# Patient Record
Sex: Male | Born: 1975 | Hispanic: Yes | Smoking: Never smoker
Health system: Southern US, Community
[De-identification: ages and names within clinical notes are randomized; demographics above are authoritative.]

## PROBLEM LIST (undated history)

## (undated) DIAGNOSIS — F419 Anxiety disorder, unspecified: Secondary | ICD-10-CM

## (undated) DIAGNOSIS — M109 Gout, unspecified: Secondary | ICD-10-CM

## (undated) DIAGNOSIS — E785 Hyperlipidemia, unspecified: Secondary | ICD-10-CM

## (undated) DIAGNOSIS — T7840XA Allergy, unspecified, initial encounter: Secondary | ICD-10-CM

## (undated) DIAGNOSIS — F32A Depression, unspecified: Secondary | ICD-10-CM

## (undated) DIAGNOSIS — I10 Essential (primary) hypertension: Secondary | ICD-10-CM

## (undated) HISTORY — DX: Hyperlipidemia, unspecified: E78.5

## (undated) HISTORY — DX: Allergy, unspecified, initial encounter: T78.40XA

## (undated) HISTORY — DX: Depression, unspecified: F32.A

## (undated) HISTORY — DX: Essential (primary) hypertension: I10

## (undated) HISTORY — DX: Anxiety disorder, unspecified: F41.9

---

## 1999-05-09 ENCOUNTER — Emergency Department (HOSPITAL_COMMUNITY): Admission: EM | Admit: 1999-05-09 | Discharge: 1999-05-09 | Payer: Self-pay | Admitting: Emergency Medicine

## 1999-07-20 ENCOUNTER — Ambulatory Visit (HOSPITAL_COMMUNITY): Admission: RE | Admit: 1999-07-20 | Discharge: 1999-07-20 | Payer: Self-pay | Admitting: *Deleted

## 2000-06-09 ENCOUNTER — Emergency Department (HOSPITAL_COMMUNITY): Admission: EM | Admit: 2000-06-09 | Discharge: 2000-06-09 | Payer: Self-pay | Admitting: Emergency Medicine

## 2000-06-24 ENCOUNTER — Emergency Department (HOSPITAL_COMMUNITY): Admission: EM | Admit: 2000-06-24 | Discharge: 2000-06-24 | Payer: Self-pay | Admitting: Family Medicine

## 2012-05-29 ENCOUNTER — Other Ambulatory Visit: Payer: Self-pay | Admitting: Physician Assistant

## 2012-05-29 ENCOUNTER — Ambulatory Visit
Admission: RE | Admit: 2012-05-29 | Discharge: 2012-05-29 | Disposition: A | Discharge status: Home / Self Care | Payer: No Typology Code available for payment source | Source: Ambulatory Visit | Attending: Physician Assistant | Admitting: Physician Assistant

## 2012-05-29 DIAGNOSIS — R609 Edema, unspecified: Secondary | ICD-10-CM

## 2012-05-29 DIAGNOSIS — R52 Pain, unspecified: Secondary | ICD-10-CM

## 2012-06-03 ENCOUNTER — Telehealth: Payer: Self-pay

## 2012-10-10 NOTE — Telephone Encounter (Signed)
o

## 2014-02-21 ENCOUNTER — Encounter (HOSPITAL_COMMUNITY): Payer: Self-pay | Admitting: Emergency Medicine

## 2014-02-21 ENCOUNTER — Emergency Department (INDEPENDENT_AMBULATORY_CARE_PROVIDER_SITE_OTHER)
Admission: EM | Admit: 2014-02-21 | Discharge: 2014-02-21 | Disposition: A | Discharge status: Home / Self Care | Payer: Self-pay | Source: Home / Self Care

## 2014-02-21 DIAGNOSIS — X58XXXA Exposure to other specified factors, initial encounter: Secondary | ICD-10-CM

## 2014-02-21 DIAGNOSIS — S93409A Sprain of unspecified ligament of unspecified ankle, initial encounter: Secondary | ICD-10-CM

## 2014-02-21 HISTORY — DX: Gout, unspecified: M10.9

## 2014-02-21 MED ORDER — INDOMETHACIN 50 MG PO CAPS: 50.0000 mg | ORAL_CAPSULE | Freq: Two times a day (BID) | ORAL | Status: DC

## 2014-02-21 MED ORDER — TRAMADOL HCL 50 MG PO TABS: 50.0000 mg | ORAL_TABLET | Freq: Four times a day (QID) | ORAL | Status: DC | PRN

## 2014-02-21 NOTE — ED Provider Notes (Signed)
CSN: [id number]     Arrival date & time 02/21/14  1516 History   First MD Initiated Contact with Patient 02/21/14 1559     Chief Complaint  Patient presents with  . Joint Swelling   (Consider location/radiation/quality/duration/timing/severity/associated sxs/prior Treatment) HPI Comments: 1 week ago pt noticed ankle pain radiating to the foot after a weekend of binge drinking. There is no known trauma, fall or other injury.  Pt points to areas of tenderness surrounding the ankle and foot ligaments. Has been ambulating with full wt bearing and sx' worsening.  Hx of similar occurrence 1-2 y ago and had a neg xray and given a green pill. The sig other is st this must be the same .    Past Medical History  Diagnosis Date  . Gout    History reviewed. No pertinent past surgical history. No family history on file. History  Substance Use Topics  . Smoking status: Passive Smoke Exposure - Never Smoker  . Smokeless tobacco: Not on file  . Alcohol Use: Yes     Comment: socially    Review of Systems  Constitutional: Negative.   Respiratory: Negative.   Gastrointestinal: Negative.   Genitourinary: Negative.   Musculoskeletal: Positive for joint swelling. Negative for myalgias.       As per HPI  Skin: Negative.   Neurological: Negative for weakness and numbness.    Allergies  Review of patient's allergies indicates no known allergies.  Home Medications   Prior to Admission medications   Not on File   BP 154/102  Pulse 82  Temp(Src) 98.2 F (36.8 C) (Oral)  Resp 18  SpO2 95% Physical Exam  Nursing note and vitals reviewed. Constitutional: He is oriented to person, place, and time. He appears well-developed and well-nourished. No distress.  HENT:  Head: Normocephalic and atraumatic.  Neck: Normal range of motion. Neck supple.  Musculoskeletal:  Swelling around the ankle and proximal foot. No erythema or other discoloration. No deformities or bony tenderness. No  abnormalities to the toes or forefoot . ROM limited due to pain around the bilateral ankle ligaments including the achilles. No signs of infection or gout or arthritis. No skin sensitivity.  Neurological: He is alert and oriented to person, place, and time. No cranial nerve deficit. He exhibits normal muscle tone.  Skin: Skin is warm and dry.  Psychiatric: He has a normal mood and affect.    ED Course  Procedures (including critical care time) Labs Review Labs Reviewed - No data to display  No results found for this or any previous visit. Imaging Review No results found.   MDM   1. Ankle sprain    ASO RICE Limit wt bearing. Follow with orhto above if not improving.    Hayden Rasmussenavid Nayquan Evinger, NP 02/21/14 [phone]

## 2014-02-21 NOTE — Discharge Instructions (Signed)
Foot Sprain The muscles and cord like structures which attach muscle to bone (tendons) that surround the feet are made up of units. A foot sprain can occur at the weakest spot in any of these units. This condition is most often caused by injury to or overuse of the foot, as from playing contact sports, or aggravating a previous injury, or from poor conditioning, or obesity. SYMPTOMS  Pain with movement of the foot.  Tenderness and swelling at the injury site.  Loss of strength is present in moderate or severe sprains. THE THREE GRADES OR SEVERITY OF FOOT SPRAIN ARE:  Mild (Grade I): Slightly pulled muscle without tearing of muscle or tendon fibers or loss of strength.  Moderate (Grade II): Tearing of fibers in a muscle, tendon, or at the attachment to bone, with small decrease in strength.  Severe (Grade III): Rupture of the muscle-tendon-bone attachment, with separation of fibers. Severe sprain requires surgical repair. Often repeating (chronic) sprains are caused by overuse. Sudden (acute) sprains are caused by direct injury or over-use. DIAGNOSIS  Diagnosis of this condition is usually by your own observation. If problems continue, a caregiver may be required for further evaluation and treatment. X-rays may be required to make sure there are not breaks in the bones (fractures) present. Continued problems may require physical therapy for treatment. PREVENTION  Use strength and conditioning exercises appropriate for your sport.  Warm up properly prior to working out.  Use athletic shoes that are made for the sport you are participating in.  Allow adequate time for healing. Early return to activities makes repeat injury more likely, and can lead to an unstable arthritic foot that can result in prolonged disability. Mild sprains generally heal in 3 to 10 days, with moderate and severe sprains taking 2 to 10 weeks. Your caregiver can help you determine the proper time required for  healing. HOME CARE INSTRUCTIONS   Apply ice to the injury for 15-20 minutes, 03-04 times per day. Put the ice in a plastic bag and place a towel between the bag of ice and your skin.  An elastic wrap (like an Ace bandage) may be used to keep swelling down.  Keep foot above the level of the heart, or at least raised on a footstool, when swelling and pain are present.  Try to avoid use other than gentle range of motion while the foot is painful. Do not resume use until instructed by your caregiver. Then begin use gradually, not increasing use to the point of pain. If pain does develop, decrease use and continue the above measures, gradually increasing activities that do not cause discomfort, until you gradually achieve normal use.  Use crutches if and as instructed, and for the length of time instructed.  Keep injured foot and ankle wrapped between treatments.  Massage foot and ankle for comfort and to keep swelling down. Massage from the toes up towards the knee.  Only take over-the-counter or prescription medicines for pain, discomfort, or fever as directed by your caregiver. SEEK IMMEDIATE MEDICAL CARE IF:   Your pain and swelling increase, or pain is not controlled with medications.  You have loss of feeling in your foot or your foot turns cold or blue.  You develop new, unexplained symptoms, or an increase of the symptoms that brought you to your caregiver. MAKE SURE YOU:   Understand these instructions.  Will watch your condition.  Will get help right away if you are not doing well or get worse. Document Released:  MAKE SURE YOU:    Understand these instructions.   Will watch your condition.   Will get help right away if you are not doing well or get worse.  Document Released: 04/13/2002 Document Revised: 01/14/2012 Document Reviewed: 06/10/2008  ExitCare Patient Information 2014 [employer]  Ankle Sprain  An ankle sprain is an injury to the strong, fibrous tissues (ligaments) that hold the bones of your ankle joint together.   CAUSES  An ankle sprain is usually caused by a fall or by twisting your ankle. Ankle sprains most commonly occur  when you step on the outer edge of your foot, and your ankle turns inward. People who participate in sports are more prone to these types of injuries.   SYMPTOMS    Pain in your ankle. The pain may be present at rest or only when you are trying to stand or walk.   Swelling.   Bruising. Bruising may develop immediately or within 1 to 2 days after your injury.   Difficulty standing or walking, particularly when turning corners or changing directions.  DIAGNOSIS   Your caregiver will ask you details about your injury and perform a physical exam of your ankle to determine if you have an ankle sprain. During the physical exam, your caregiver will press on and apply pressure to specific areas of your foot and ankle. Your caregiver will try to move your ankle in certain ways. An X-ray exam may be done to be sure a bone was not broken or a ligament did not separate from one of the bones in your ankle (avulsion fracture).   TREATMENT   Certain types of braces can help stabilize your ankle. Your caregiver can make a recommendation for this. Your caregiver may recommend the use of medicine for pain. If your sprain is severe, your caregiver may refer you to a surgeon who helps to restore function to parts of your skeletal system (orthopedist) or a physical therapist.  HOME CARE INSTRUCTIONS    Apply ice to your injury for 1 2 days or as directed by your caregiver. Applying ice helps to reduce inflammation and pain.   Put ice in a plastic bag.   Place a towel between your skin and the bag.   Leave the ice on for 15-20 minutes at a time, every 2 hours while you are awake.   Only take over-the-counter or prescription medicines for pain, discomfort, or fever as directed by your caregiver.   Elevate your injured ankle above the level of your heart as much as possible for 2 3 days.   If your caregiver recommends crutches, use them as instructed. Gradually put weight on the affected ankle. Continue to use crutches or a cane  until you can walk without feeling pain in your ankle.   If you have a plaster splint, wear the splint as directed by your caregiver. Do not rest it on anything harder than a pillow for the first 24 hours. Do not put weight on it. Do not get it wet. You may take it off to take a shower or bath.   You may have been given an elastic bandage to wear around your ankle to provide support. If the elastic bandage is too tight (you have numbness or tingling in your foot or your foot becomes cold and blue), adjust the bandage to make it comfortable.   If you have an air splint, you may blow more air into it or let air out to make it more   comfortable. You may take your splint off at night and before taking a shower or bath. Wiggle your toes in the splint several times per day to decrease swelling.  SEEK MEDICAL CARE IF:    You have rapidly increasing bruising or swelling.   Your toes feel extremely cold or you lose feeling in your foot.   Your pain is not relieved with medicine.  SEEK IMMEDIATE MEDICAL CARE IF:   Your toes are numb or blue.   You have severe pain that is increasing.  MAKE SURE YOU:    Understand these instructions.   Will watch your condition.   Will get help right away if you are not doing well or get worse.  Document Released: 10/22/2005 Document Revised: 07/16/2012 Document Reviewed: 11/03/2011  Exit[employer] Patient Information 2014 [employer].

## 2014-02-21 NOTE — ED Notes (Signed)
Denies inj.  C/O left lateral ankle pain and swelling since Sunday; pain radiating some into foot & up into lower leg.  Has felt feverish intermittently.  Has tried Epsom salts, ice, muscle rubs, IBU without relief.  C/O painful weightbearing.  Was told last yr he "probably has gout".

## 2014-02-22 NOTE — ED Provider Notes (Signed)
Medical screening examination/treatment/procedure(s) were performed by non-physician practitioner and as supervising physician I was immediately available for consultation/collaboration.  Leslee Homeavid Monaca Wadas, M.D.   Reuben Likesavid C Brigette Hopfer, MD 02/22/14 1031

## 2014-08-24 ENCOUNTER — Encounter (HOSPITAL_COMMUNITY): Payer: Self-pay | Admitting: Emergency Medicine

## 2014-08-24 ENCOUNTER — Emergency Department (HOSPITAL_COMMUNITY): Payer: Self-pay

## 2014-08-24 ENCOUNTER — Emergency Department (HOSPITAL_COMMUNITY)
Admission: EM | Admit: 2014-08-24 | Discharge: 2014-08-24 | Disposition: A | Discharge status: Home / Self Care | Payer: Self-pay | Attending: Emergency Medicine | Admitting: Emergency Medicine

## 2014-08-24 DIAGNOSIS — M109 Gout, unspecified: Secondary | ICD-10-CM | POA: Insufficient documentation

## 2014-08-24 MED ORDER — INDOMETHACIN 25 MG PO CAPS: 25.0000 mg | ORAL_CAPSULE | Freq: Three times a day (TID) | ORAL | Status: DC | PRN

## 2014-08-24 MED ORDER — INDOMETHACIN 25 MG PO CAPS
25.0000 mg | ORAL_CAPSULE | Freq: Once | ORAL | Status: AC
  Administered 2014-08-24: 25 mg via ORAL
  Filled 2014-08-24: qty 1

## 2014-08-24 MED ORDER — HYDROCODONE-ACETAMINOPHEN 5-325 MG PO TABS: 1.0000 | ORAL_TABLET | ORAL | Status: DC | PRN

## 2014-08-24 MED ORDER — OXYCODONE-ACETAMINOPHEN 5-325 MG PO TABS
1.0000 | ORAL_TABLET | Freq: Once | ORAL | Status: AC
  Administered 2014-08-24: 1 via ORAL
  Filled 2014-08-24: qty 1

## 2014-08-24 NOTE — Progress Notes (Signed)
[facility] Stacy,  ° °Provided pt with a list of primary care resources and a [facility] [employer] [facility] application to help patient establish a pcp.  °

## 2014-08-24 NOTE — ED Provider Notes (Signed)
Medical screening examination/treatment/procedure(s) were performed by non-physician practitioner and as supervising physician I was immediately available for consultation/collaboration.   EKG Interpretation None       Abbott Jasinski, MD 08/24/14 1626 

## 2014-08-24 NOTE — ED Notes (Signed)
Pt co left ankle edema started on Saturday. Pt denies injury to ankle. Pt states that is painful 10/10.

## 2014-08-24 NOTE — ED Provider Notes (Signed)
CSN: [id number]     Arrival date & time 08/24/14  0830 History   First MD Initiated Contact with Patient 08/24/14 [phone]     Chief Complaint  Patient presents with  . Ankle Pain    left;edema     (Consider location/radiation/quality/duration/timing/severity/associated sxs/prior Treatment) HPI  Patient to the ED withcomplaints of swelling and pain to his left ankle for 2 days. He has a history of the same presentation multiple times over the past few years. He has been told before that it is gout. He denies injury to the ankle. 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 Denies fevers, nausea, weakness. Reports pain with even light touch.  Past Medical History  Diagnosis Date  . Gout    History reviewed. No pertinent past surgical history. No family history on file. History  Substance Use Topics  . Smoking status: Never Smoker   . Smokeless tobacco: Not on file  . Alcohol Use: Yes     Comment: socially    Review of Systems   Review of Systems  Gen: no weight loss, fevers, chills, night sweats  Eyes: no occular draining, occular pain,  No visual changes  Nose: no epistaxis or rhinorrhea  Mouth: no dental pain, no sore throat  Neck: no neck pain  Lungs: No hemoptysis. No wheezing or coughing CV:  No palpitations, dependent edema or orthopnea. No chest pain Abd: no diarrhea. No nausea or vomiting, No abdominal pain  GU: no dysuria or gross hematuria  MSK:  No muscle weakness, + left ankle pain Neuro: no headache, no focal neurologic deficits  Skin: no rash , no wounds Psyche: no complaints of depression or anxiety    Allergies  Review of patient's allergies indicates no known allergies.  Home Medications   Prior to Admission medications   Medication Sig Start Date End Date Taking? Authorizing Provider  ibuprofen (ADVIL,MOTRIN) 200 MG tablet Take 400 mg by mouth every 4 (four) hours as needed for moderate pain.   Yes Historical  Provider, MD  HYDROcodone-acetaminophen (NORCO/VICODIN) 5-325 MG per tablet Take 1-2 tablets by mouth every 4 (four) hours as needed for moderate pain or severe pain. 08/24/14   Annalycia Done Irine SealG Mele Sylvester, PA-C  indomethacin (INDOCIN) 25 MG capsule Take 1 capsule (25 mg total) by mouth 3 (three) times daily as needed. 08/24/14   Weber Monnier Irine SealG Ramandeep Arington, PA-C   BP 148/93  Pulse 75  Temp(Src) 98.5 F (36.9 C) (Oral)  Resp 16  SpO2 98% Physical Exam  Nursing note and vitals reviewed. Constitutional: He appears well-developed and well-nourished. No distress.  HENT:  Head: Normocephalic and atraumatic.  Eyes: Pupils are equal, round, and reactive to light.  Neck: Normal range of motion. Neck supple.  Cardiovascular: Normal rate and regular rhythm.   Pulmonary/Chest: Effort normal.  Abdominal: Soft.  Musculoskeletal:       Left ankle: He exhibits decreased range of motion (due to pain) and swelling. He exhibits no ecchymosis, no deformity, no laceration and normal pulse. Tenderness (to light touch).  Ankle is indurated  Neurological: He is alert.  Skin: Skin is warm and dry.    ED Course  Procedures (including critical care time) Labs Review Labs Reviewed - No data to display  Imaging Review Dg Ankle Complete Left  08/24/2014   CLINICAL DATA:  Left ankle pain starting on Saturday morning. No known injury. Lateral pain and soft tissue swelling.  EXAM: LEFT ANKLE COMPLETE - 3+ VIEW  COMPARISON:  05/29/2012  FINDINGS: There are stable degenerative changes likely related to previous ankle sprains and avulsion injuries. No acute fracture or osteochondral lesion. Os trigonum is noted. Suspect a small ankle joint effusion. The mid and hindfoot bony structures are intact.  IMPRESSION: Stable ankle joint degenerative changes without acute bony abnormality.  Possible ankle joint effusion.   Electronically Signed   By: Loralie ChampagneMark  Gallerani M.D.   On: 08/24/2014 09:08     EKG Interpretation None      MDM    Final diagnoses:  Acute gout of left ankle, unspecified cause    Patient has received listed medications in the ED: Medications  oxyCODONE-acetaminophen (PERCOCET/ROXICET) 5-325 MG per tablet 1 tablet (not administered)  indomethacin (INDOCIN) capsule 25 mg (not administered)    Patient with hx of gout, says this feels like the same. Denies injury or feeling sick. Has crutches from home that he is using. I considered possible infection, however, the patient does not have any risk factors for infection. Discussed need to follow-up if symptoms are not improving.  38 y.o.Eric Sanford's evaluation in the Emergency Department is complete. It has been determined that no acute conditions requiring further emergency intervention are present at this time. The patient/guardian have been advised of the diagnosis and plan. We have discussed signs and symptoms that warrant return to the ED, such as changes or worsening in symptoms.  Vital signs are stable at discharge. Filed Vitals:   08/24/14 0835  BP: 148/93  Pulse: 75  Temp: 98.5 F (36.9 C)  Resp: 16    Patient/guardian has voiced understanding and agreed to follow-up with the PCP or specialist.     Dorthula Matasiffany G Nekayla Heider, PA-C 08/24/14 [phone]

## 2014-08-24 NOTE — ED Notes (Signed)
Patient transported to X-ray 

## 2014-08-24 NOTE — Discharge Instructions (Signed)
Ankle Fusion  Ankle fusion surgery joins (fuses) ankle and leg bones together. The surgery uses devices such as screws, plates, rods, pins, and sometimes bone graft material. This is usually done to repair damage to the ankle due to arthritis, injury, or infection. It is also done to repair other ankle and foot conditions that cause pain.  LET YOUR CAREGIVER KNOW ABOUT:   · Allergies to food or medicine.  · Medicines taken, including vitamins, herbs, eyedrops, over-the-counter medicines, and creams.  · Use of steroids (by mouth or creams).  · Previous problems with anesthetics or numbing medicines.  · History of bleeding problems or blood clots.  · Previous surgery.  · Other health problems, including diabetes and kidney problems.  · Possibility of pregnancy, if this applies.  RISKS AND COMPLICATIONS   As with any surgery, complications may occur. However, they can usually be managed by your caregiver. General surgical complications may include:  · Reaction to anesthesia.  · Damage to surrounding nerves, tissues, or blood vessels.  · Infection.  · Bleeding.  · Scarring.  · Blood clot.  With appropriate treatment and rehabilitation, the following complications are very uncommon:  · Failure to heal (nonunion).  · Healing in a poor position (malunion).  · Stiff ankle or loss of mobility.  BEFORE THE PROCEDURE   Follow your caregiver's instructions prior to your surgery to avoid complications. A physical exam and X-rays may be performed as well. You may be asked to:   · Stop taking certain medicines for several days prior to your surgery, such as blood thinners (anticoagulants).  · Avoid eating and drinking for at least 8 hours before the surgery. This will help you avoid complications from anesthesia.  · Quit smoking, if this applies. Smoking increases the chances of a healing problem after your surgery. If you are thinking about quitting, ask your surgeon how long before the surgery you should stop smoking. Ask your  primary caregiver about approaches to help you stop. This can include medicines.  · Arrange for a ride home after your surgery. Arrange for someone to help you with activities during recovery.  PROCEDURE   The surgery is done after you are given medicine that makes you sleep (general anesthetic) or medicine that makes you numb from the waist down (spinal anesthetic). You will be asleep and will not feel any pain. The surgery can be performed with open surgery or minimally invasive surgery using a thin, lighted tube inserted through a small cut (arthroscopy). There are several variations of ankle fusion, but the basic surgery involves the following:  Open Surgery  Cuts (incisions) are made on each side of the ankle to allow the surgeon to access the joint. Once the joint is opened, cartilage and bone surfaces may be removed and reshaped if needed. The joint is repositioned in the proper place and joined together with a device, such as screws or plates. The body's natural healing process will help fuse together newly formed surfaces within the ankle. Though less common, sometimes pins within the ankle joint are attached to outside rods (external fixator) to aid in the healing process.  Arthroscopic Surgery  Small keyhole incisions are made on either side of the ankle. The surgeon will insert small instruments into the ankle joint. A tiny camera allows the surgeon to view inside the ankle. Small tools are used to resurface the cartilage area and bones of the ankle. Screws are placed through the skin and ankle to help it stay   in position while it heals.  AFTER THE PROCEDURE   · You will likely be in the hospital for 1 to 2 days following surgery.  · Caregivers will help you to manage pain and swelling with medicines, ice, and raising (elevation) of the ankle.  · Full recovery will take several months, but you may be able to walk gently in about 12 weeks. Crutches will help you move around when you first return  home.  · A cast or splint will be applied to the lower leg. You will learn how to walk with crutches.  · Follow your caregiver's instructions for home care after surgery.  FOR MORE INFORMATION   [employer]: www.aaos.org  Document Released: 04/11/2010 Document Revised: 01/14/2012 Document Reviewed: 04/11/2010  [employer]® Patient Information ©2015 [employer]. This information is not intended to replace advice given to you by your health care provider. Make sure you discuss any questions you have with your health care provider.

## 2015-09-08 ENCOUNTER — Emergency Department (HOSPITAL_COMMUNITY): Payer: Self-pay

## 2015-09-08 ENCOUNTER — Encounter (HOSPITAL_COMMUNITY): Payer: Self-pay | Admitting: Emergency Medicine

## 2015-09-08 ENCOUNTER — Emergency Department (HOSPITAL_COMMUNITY)
Admission: EM | Admit: 2015-09-08 | Discharge: 2015-09-08 | Disposition: A | Discharge status: Home / Self Care | Payer: Self-pay | Attending: Emergency Medicine | Admitting: Emergency Medicine

## 2015-09-08 DIAGNOSIS — F410 Panic disorder [episodic paroxysmal anxiety] without agoraphobia: Secondary | ICD-10-CM | POA: Insufficient documentation

## 2015-09-08 DIAGNOSIS — M109 Gout, unspecified: Secondary | ICD-10-CM | POA: Insufficient documentation

## 2015-09-08 NOTE — Discharge Instructions (Signed)
FOLLOW UP WITH A PRIMARY CARE PROVIDER IF SYMPTOMS RECUR. A LIST OF PROVIDERS IS PART OF YOUR DISCHARGE INSTRUCTIONS. RETURN TO THE EMERGENCY DEPARTMENT AS NEEDED.  Panic Attacks Panic attacks are sudden, short-livedsurges of severe anxiety, fear, or discomfort. They may occur for no reason when you are relaxed, when you are anxious, or when you are sleeping. Panic attacks may occur for a number of reasons:   Healthy people occasionally have panic attacks in extreme, life-threatening situations, such as war or natural disasters. Normal anxiety is a protective mechanism of the body that helps [place] react to danger (fight or flight response).  Panic attacks are often seen with anxiety disorders, such as panic disorder, social anxiety disorder, generalized anxiety disorder, and phobias. Anxiety disorders cause excessive or uncontrollable anxiety. They may interfere with your relationships or other life activities.  Panic attacks are sometimes seen with other mental illnesses, such as depression and posttraumatic stress disorder.  Certain medical conditions, prescription medicines, and drugs of abuse can cause panic attacks. SYMPTOMS  Panic attacks start suddenly, peak within 20 minutes, and are accompanied by four or more of the following symptoms:  Pounding heart or fast heart rate (palpitations).  Sweating.  Trembling or shaking.  Shortness of breath or feeling smothered.  Feeling choked.  Chest pain or discomfort.  Nausea or strange feeling in your stomach.  Dizziness, light-headedness, or feeling like you will faint.  Chills or hot flushes.  Numbness or tingling in your lips or hands and feet.  Feeling that things are not real or feeling that you are not yourself.  Fear of losing control or going crazy.  Fear of dying. Some of these symptoms can mimic serious medical conditions. For example, you may think you are having a heart attack. Although panic attacks can be very scary,  they are not life threatening. DIAGNOSIS  Panic attacks are diagnosed through an assessment by your health care provider. Your health care provider will ask questions about your symptoms, such as where and when they occurred. Your health care provider will also ask about your medical history and use of alcohol and drugs, including prescription medicines. Your health care provider may order blood tests or other studies to rule out a serious medical condition. Your health care provider may refer you to a mental health professional for further evaluation. TREATMENT   Most healthy people who have one or two panic attacks in an extreme, life-threatening situation will not require treatment.  The treatment for panic attacks associated with anxiety disorders or other mental illness typically involves counseling with a mental health professional, medicine, or a combination of both. Your health care provider will help determine what treatment is best for you.  Panic attacks due to physical illness usually go away with treatment of the illness. If prescription medicine is causing panic attacks, talk with your health care provider about stopping the medicine, decreasing the dose, or substituting another medicine.  Panic attacks due to alcohol or drug abuse go away with abstinence. Some adults need professional help in order to stop drinking or using drugs. HOME CARE INSTRUCTIONS   Take all medicines as directed by your health care provider.   Schedule and attend follow-up visits as directed by your health care provider. It is important to keep all your appointments. SEEK MEDICAL CARE IF:  You are not able to take your medicines as prescribed.  Your symptoms do not improve or get worse. SEEK IMMEDIATE MEDICAL CARE IF:   You experience panic  attack symptoms that are different than your usual symptoms.  You have serious thoughts about hurting yourself or others.  You are taking medicine for panic  attacks and have a serious side effect. MAKE SURE YOU:  Understand these instructions.  Will watch your condition.  Will get help right away if you are not doing well or get worse.   This information is not intended to replace advice given to you by your health care provider. Make sure you discuss any questions you have with your health care provider.   Document Released: 10/22/2005 Document Revised: 10/27/2013 Document Reviewed: 06/05/2013 Elsevier Interactive Patient Education 2016 [employer]. Crisis de angustia (Panic Attacks) Las crisis de [place] son ataques repentinos y [place] de [place], miedo o Dentist extremos. Es posible que ocurran sin motivo, cuando est relajado, ansioso o cuando duerme. Las crisis de [place] pueden ocurrir por algunas de estas razones:   En ocasiones, las personas sanas presentan crisis de [place] en situaciones extremas, potencialmente mortales, como la guerra o los desastres naturales. La ansiedad normal es un mecanismo de defensa del cuerpo que nos ayuda a Publishing rights manager ante situaciones de peligro (respuesta de defensa o huida).  Con frecuencia, las crisis de [place] aparecen acompaadas de trastornos de ansiedad, como trastorno de pnico, trastorno de ansiedad social, trastorno de ansiedad generalizada y fobias. Los trastornos de ansiedad provocan ansiedad excesiva o incontrolable. Sus relaciones y [address] pueden verse Education officer, environmental.  En ocasiones, las crisis de ansiedad se presentan con otras enfermedades mentales, como la depresin y el trastorno por estrs postraumtico.  Algunas enfermedades, medicamentos recetados y drogas pueden provocar crisis de [place]. SNTOMAS  Las crisis de [place] comienzan repentinamente, Writer punto mximo a los 20 minutos y se presentan junto con cuatro o ms de los siguientes sntomas:  Latidos cardacos acelerados o frecuencia cardaca elevada (palpitaciones).  Sudoracin.  Temblores o  sacudidas.  Dificultad para respirar o sensacin de asfixia.  Sensacin de [employer].  Dolor o International aid/development worker.  Nuseas o sensacin extraa en el estmago.  Mareos, sensacin de desvanecimiento o de desmayo.  Escalofros o sofocos.  Hormigueos o adormecimiento en los labios o las manos y los pies.  Sensacin de [employer] no son reales o de que no es usted mismo.  Temor a perder el control o el juicio.  Temor a Musician. Algunos de estos sntomas pueden parecerse a enfermedades graves. Por ejemplo, es posible que piense que tendr un ataque cardaco. Aunque las crisis de [place] pueden ser muy atemorizantes, no son potencialmente mortales. DIAGNSTICO  Las crisis de [place] se diagnostican con una evaluacin que realiza el mdico. Su mdico le realizar preguntas sobre los sntomas, como cundo y dnde ocurrieron. Tambin le preguntar sobre su historia clnica y [place] consumo de alcohol y drogas, incluidos los medicamentos recetados. Es posible que su mdico le indique anlisis de sangre u otros estudios para Museum/gallery exhibitions officer graves. El mdico podr derivarlo a un profesional de la salud mental para que le realice una evaluacin ms profunda. TRATAMIENTO   En general, las personas sanas que registran una o [place] crisis de [place] bajo una situacin extrema, potencialmente mortal, no requerirn TEFL teacher.  El [place] de las crisis de [place] asociadas con trastornos de ansiedad u otras enfermedades mentales, generalmente, requiere orientacin por parte de un profesional de la salud mental medicamentos, o bien la combinacin de [place]. Su mdico le ayudar a Leisure centre manager tratamiento para usted.  Las crisis de [place] a enfermedades  fsicas, generalmente, desaparecen con el tratamiento de la enfermedad. Si un medicamento recetado le causa crisis de [place], consulte a su mdico si debe suspenderlo, disminuir la dosis o sustituirlo por otro  medicamento.  Las crisis de [place] asociadas al consumo de drogas o alcohol desaparecen con la abstinencia. Algunos adultos necesitan ayuda profesional para dejar de beber o de consumir drogas. INSTRUCCIONES PARA EL CUIDADO EN EL HOGAR   Tome todos los medicamentos como le indic el mdico.  Planifique y concurra a todas las visitas de control, segn le indique el mdico. Es importante que concurra a todas las visitas. SOLICITE ATENCIN MDICA SI:  No puede tomar los [employer] se lo han indicado.  Los sntomas no mejoran o empeoran. SOLICITE ATENCIN MDICA DE INMEDIATO SI:   Experimenta sntomas de crisis de [place] diferentes de los que presenta habitualmente.  Tiene pensamientos serios acerca de lastimarse a usted mismo o daar a Economist.  Toma medicamentos para las crisis de [place] y presenta efectos secundarios graves. ASEGRESE DE QUE:  Comprende estas instrucciones.  Controlar su afeccin.  Recibir ayuda de inmediato si no mejora o si empeora.   Esta informacin no tiene Theme park manager el consejo del mdico. Asegrese de hacerle al mdico cualquier pregunta que tenga.   Document Released: 10/22/2005 Document Revised: 10/27/2013 Elsevier Interactive Patient Education [employer]   Emergency Department Resource Guide 1) Find a Doctor and Pay Out of Pocket Although you won't have to find out who is covered by your insurance plan, it is a good idea to ask around and get recommendations. You will then need to call the office and see if the doctor you have chosen will accept you as a new patient and what types of options they offer for patients who are self-pay. Some doctors offer discounts or will set up payment plans for their patients who do not have insurance, but you will need to ask so you aren't surprised when you get to your appointment.  2) Contact Your Local Health Department Not all health departments have doctors that can see  patients for sick visits, but many do, so it is worth a call to see if yours does. If you don't know where your local health department is, you can check in your phone book. The CDC also has a tool to help you locate your state's health department, and many state websites also have listings of all of their local health departments.  3) Find a Walk-in Clinic If your illness is not likely to be very severe or complicated, you may want to try a walk in clinic. These are popping up all over the country in pharmacies, drugstores, and shopping centers. They're usually staffed by nurse practitioners or physician assistants that have been trained to treat common illnesses and complaints. They're usually fairly quick and inexpensive. However, if you have serious medical issues or chronic medical problems, these are probably not your best option.  No Primary Care Doctor: - Call Health Connect at  [phone] - they can help you locate a primary care doctor that  accepts your insurance, provides certain services, etc. - Physician Referral Service- [phone]  Chronic Pain Problems: Organization         Address  Phone   Notes  [facility]  [phone] Patients need to be referred by their primary care doctor.   Medication Assistance: Organization         Address  Phone   Notes  [facility] Medication Assistance Program [address] [place] [place], [place] [address] [phone] --Must be a resident of [facility] -- Must have NO insurance coverage whatsoever (no Medicaid/ Medicare, etc.) -- The pt. MUST have a primary care doctor that directs their care regularly and follows them in the community   Med[facility]ist  [phone]   [employer]  [phone]    Agencies that provide inexpensive medical care: Organization         Address  Phone   Notes  [facility]  [phone]   [facility]    [phone]   [facility] [address] [place], [place] [address] [phone]   [facility] [address] [place] [place] [phone]   Planned Parenthood    [phone]   [facility]    [phone]   [facility]  [address], [place] Phone:  [phone], Fax:  [phone] Hours of Operation:  9 am - 6 pm, M-F.  Also accepts Medicaid/Medicare and self-pay.  [facility]  [address], [place] Phone: [phone], Fax: [phone]. Hours of Operation:  8:30 am - 5:30 pm, M-F.  Also accepts Medicaid and self-pay.  [facility] [place] [address] [place] Phone: [phone]   [facility] [address] Natasha Bence [place], [place] [phone], Ext. [phone] Mondays & Thursdays: 7-9 AM.  First 15 patients are seen on a first come, first serve basis.    Medicaid-accepting [facility] Providers:  Organization         Address  Phone   Notes  [facility] [address], [place] [phone] Also accepts self-pay patients.  [facility] [address] Laurell Josephs [place], [place]  [phone]   [facility] [address], [place] [phone]   [facility] [address], [place] [phone]   Renaye Rakers [address], [place]   [phone] Only accepts [place] Access [place] patients after they have their name applied to their card.   Self-Pay (no insurance) in [facility]:  Organization         Address  Phone   Notes  Sickle Cell Patients, [facility] [address] [place], [place] [phone]   [facility] [address] [place], [place] [phone]   [facility] [place]  [address], [place] [phone]   [facility]/Dr. Osei-Bonsu   [address], [place] or [address], [place] [phone] Phone number for both [place] and [place] locations is the same.  [facility] [address] [place] [phone]   [facility] [address] [place] or [address] [phone]   [facility] [address] [place] [phone], phone; [phone], fax Sees patients 1st and 3rd Saturday of every month.  Must not qualify for public or private insurance (i.e. Medicaid, Medicare, [place] Health Choice, Veterans' Benefits)  Household income should be no more than 200% of the poverty level The clinic cannot treat you if you are pregnant or think you are pregnant  Sexually transmitted diseases are  not treated at the clinic.    Dental Care: Organization         Address  Phone  Notes  [facility] Department of [facility] [address] [place], [place] [phone] Accepts children up to age 29 who are enrolled in [place] or [place] Health Choice; pregnant women with a Medicaid card; and children who have applied for Medicaid or [place] Health Choice, but were declined, whose parents can pay a reduced fee at time of service.  [facility] Department of [facility]  [address], [place] [phone] Accepts children up to age 85 who are enrolled in [place] or [place] Health Choice; pregnant women with a Medicaid card; and children who have applied for Medicaid or [place] Health Choice, but were declined, whose parents can pay a reduced fee at time of service.  [facility]  [address] [place], [place] [phone] Patients are seen by appointment only. Walk-ins are not accepted. [facility] will see patients 11 years of age and older. Monday - Tuesday (8am-5pm) Most Wednesdays (8:30-5pm) $30 per visit, cash only  [facility] Adult Dental Access  PROGRAM  [address], [facility] [phone] Patients are seen by appointment only. Walk-ins are not accepted. [facility] will see patients 81 years of age and older. One Wednesday Evening (Monthly: Volunteer Based).  $30 per visit, cash only  [employer]  [phone] for adults; Children under age 22, call Graduate Pediatric Dentistry at [phone]. Children aged 53-14, please call [phone] to request a pediatric application.  Dental services are provided in all areas of dental care including fillings, crowns and bridges, complete and partial dentures, implants, gum treatment, root canals, and extractions. Preventive care is also provided. Treatment is provided to both adults and children. Patients are selected via a lottery and there is often a waiting list.   [facility] [address], [place]  [phone] [email or web address]   [facility] [address] [place], [place] [phone], Ext. [phone] Second and Fourth Thursday of each month, opens at 6:30 AM; Clinic ends at 9 AM.  Patients are seen on a first-come first-served basis, and a limited number are seen during each clinic.   [facility]  [address] Ether Griffins [place], [place] [phone]   Eligibility Requirements You must have lived in [place], [place], or [place] counties for at least the last three months.   You cannot be eligible for state or federal sponsored [place], including [employer], [place], or [employer].   You generally cannot be eligible for healthcare insurance through your employer.    How to apply: Eligibility screenings are held every Tuesday and Wednesday afternoon from 1:00 pm until 4:00 pm. You do not need an appointment for the interview!  [facility] [address] [place], [place] [phone]   [facility] Health Department  [phone]   [facility] Health Department   [phone]   [facility] Health Department  [phone]    Behavioral Health Resources in the Community: Intensive Outpatient Programs Organization         Address  Phone  Notes  [facility] [address] [place], [place] [phone]   [facility] Outpatient [address] [place], [place] [phone]   [facility]: [facility] Drug S[facility] [address] [place], [place]  [phone]  [facility] [address],  [place], [place] [phone] or [phone]   Substance Abuse Resources Organization         Address  Phone  Notes  [facility]  [phone]   [facility]  [phone]   [facility]  [phone]   [facility]  [phone]   Residential & Outpatient Substance Abuse Program  [phone]   Psychological Services Organization         Address  Phone  Notes  [facility]  [phone]- [phone]   [facility]  [phone]- [phone]   [facility] [address], [place] [phone] or [phone]    Mobile Crisis Teams Organization         Address  Phone  Notes  [facility], Mobile Crisis Care Unit  [phone]   [facility]  [address] [place], [place] [phone]   Doristine LocksSharon DeEsch [address] [place] [phone]    Self-Help/Support Groups Organization         Address  Phone             Notes  [facility] [place] - variety of support groups  [phone] Call for more information  Narcotics Anonymous (NA), [facility] [address], [place]  2 meetings at this location   Statisticianesidential Treatment Programs Organization         Address  Phone  Notes  [facility] [address],    [place]  [phone]   [facility]  [address], [place] [phone], [place], [place] [phone]   [facility] [address], [place] [phone] Admissions: 8am-3pm M-F  [facility] [address]    [place], [place] [phone]   [facility] [address] [place] [address], [place], [place] [phone]   [facility] [address]  [place], [place] [phone]   [facility] - Intensive Outpatient [address], [place], [place] [phone]   [facility] ([facility] [address]  [place], [place] [phone] or [phone]   [facility] ([facility]) [address] [place], [place] [phone] Accepts Medicaid  [facility] [address]  [place] [phone] Substance Abuse/Addiction Treatment   [facility]al Health Resources Organization         Address  Phone  Notes  [facility]  [phone]   Angie FavaJulie Brannon, PhD [address] [place], [place]   [phone] or [phone]   [facility]   [address] [place], [place] [phone]   [facility] [address], [place], [place] [phone] Insurance/Medicaid/sponsorship through [facility]  [facility] [address]                                    [place], [place] [phone] Therapy/tele-psych/case  [facility] [address]   [place], [place] [phone]) [phone]    Dr. Lolly MustacheArfeen  [phone]   [facility] [place]  [employer] [facility] 1) [address], [place] 2) [address], [place] 3)  [address], [place] [phone]  [phone]   [facility] Child Abuse Hotline [phone] or [phone] (After Hours)

## 2015-09-08 NOTE — ED Notes (Signed)
Per EMS: Pt states he was driving home and felt like he could not catch his breath, lasted for about 15 minutes and then hands were cramping and numb.  FD got there, pt's HR was 122. Upon EMS arrival, HR was 100.  EKG unremarkable.  Denies any other complaints.

## 2015-09-08 NOTE — ED Notes (Signed)
Patient calm and cooperative 0- water provided

## 2015-09-08 NOTE — ED Provider Notes (Signed)
CSN: [id number]     Arrival date & time 09/08/15  1857 History  By signing my name below, I, Phillis HaggisGabriella Gaje, attest that this documentation has been prepared under the direction and in the presence of Elpidio AnisShari Yazeed Pryer, PA-C. Electronically Signed: Phillis HaggisGabriella Gaje, ED Scribe. 09/08/2015. 9:18 PM.   Chief Complaint  Patient presents with  . Anxiety   The history is provided by the patient. No language interpreter was used.  HPI Comments: Eric Sanford is a 39 y.o. Male brought in by EMS who presents to the Emergency Department complaining of anxiety onset 2 hours. Pt states that he was driving home from work when he felt like he could not catch his breath for about 15 minutes. He reports associated cramping and stiffness in his hands and increased HR that have since resolved. He states that his breathing has improved but continues to feel like he cannot catch his breath. He reports hx of similar symptoms. He denies drug use, daily alcohol use, or recent stressful factors. Pt states that he has been well hydrated today and works outside. He denies nausea, vomiting, chest pain, abdominal pain, cough, leg swelling, syncope, or self injury.   Past Medical History  Diagnosis Date  . Gout    No past surgical history on file. No family history on file. Social History  Substance Use Topics  . Smoking status: Never Smoker   . Smokeless tobacco: None  . Alcohol Use: Yes     Comment: socially    Review of Systems  Respiratory: Positive for shortness of breath. Negative for cough.   Cardiovascular: Negative for chest pain.  Gastrointestinal: Negative for nausea and vomiting.  Psychiatric/Behavioral: Negative for self-injury. The patient is nervous/anxious.    Allergies  Review of patient's allergies indicates no known allergies.  Home Medications   Prior to Admission medications   Medication Sig Start Date End Date Taking? Authorizing Provider  HYDROcodone-acetaminophen (NORCO/VICODIN) 5-325 MG  per tablet Take 1-2 tablets by mouth every 4 (four) hours as needed for moderate pain or severe pain. 08/24/14   Tiffany Neva SeatGreene, PA-C  ibuprofen (ADVIL,MOTRIN) 200 MG tablet Take 400 mg by mouth every 4 (four) hours as needed for moderate pain.    Historical Provider, MD  indomethacin (INDOCIN) 25 MG capsule Take 1 capsule (25 mg total) by mouth 3 (three) times daily as needed. 08/24/14   Tiffany Neva SeatGreene, PA-C   BP 155/81 mmHg  Pulse 93  Temp(Src) 97.5 F (36.4 C) (Oral)  Resp 20  SpO2 100%  Physical Exam  Constitutional: He is oriented to person, place, and time. He appears well-developed and well-nourished.  HENT:  Head: Normocephalic and atraumatic.  Eyes: EOM are normal.  Neck: Normal range of motion. Neck supple.  Cardiovascular: Normal rate, regular rhythm and normal heart sounds.   Pulmonary/Chest: Effort normal and breath sounds normal.  Abdominal: Soft. There is no tenderness.  Musculoskeletal: Normal range of motion. He exhibits no tenderness.  Neurological: He is alert and oriented to person, place, and time.  Skin: Skin is warm and dry.  Psychiatric: He has a normal mood and affect. His behavior is normal.  Nursing note and vitals reviewed.   ED Course  Procedures (including critical care time) DIAGNOSTIC STUDIES: Oxygen Saturation is 100% on RA, normal  by my interpretation.    COORDINATION OF CARE: 8:39 PM-Discussed treatment plan which includes chest x-ray with pt at bedside and pt agreed to plan.   Labs Review Labs Reviewed - No data to  display  Imaging Review Dg Chest 2 View  09/08/2015  CLINICAL DATA:  39 year old who became acutely short of breath while driving home earlier this evening. The episode lasted for approximately 15 min at which time he began having cramping and numbness in the hands. EXAM: CHEST  2 VIEW COMPARISON:  None. FINDINGS: Suboptimal inspiration accounts for crowded bronchovascular markings, especially in the bases, and accentuates the  cardiac silhouette. Taking this into account, cardiomediastinal silhouette unremarkable. Lungs clear. Bronchovascular markings normal. Pulmonary vascularity normal. No visible pleural effusions. No pneumothorax. Visualized bony thorax intact. IMPRESSION: Suboptimal inspiration.  No acute cardiopulmonary disease. Electronically Signed   By: Hulan Saas M.D.   On: 09/08/2015 21:15   I have personally reviewed and evaluated these images and lab results as part of my medical decision-making.   EKG Interpretation None      MDM   Final diagnoses:  None    1. Panic attack 2. Dyspnea  Well appearing patient with normal vital signs, no hypoxia, normal CXR with symptoms c/w panic/anxiety. Will recommend outpatient follow up and give strict return precautions.   I personally performed the services described in this documentation, which was scribed in my presence. The recorded information has been reviewed and is accurate.     Elpidio Anis, PA-C 09/08/15 2137  Vanetta Mulders, MD 09/11/15 [phone]

## 2015-11-06 HISTORY — PX: TOOTH EXTRACTION: SUR596

## 2016-12-01 IMAGING — CR DG CHEST 2V
2 series · 2 of 2 positions shown · non-contrast
Comparison: None.

CLINICAL DATA: 39-year-old who became acutely short of breath while
driving home earlier this evening. The episode lasted for
approximately 15 min at which time he began having cramping and
numbness in the hands.

EXAM:
CHEST  2 VIEW

[w chest pa]
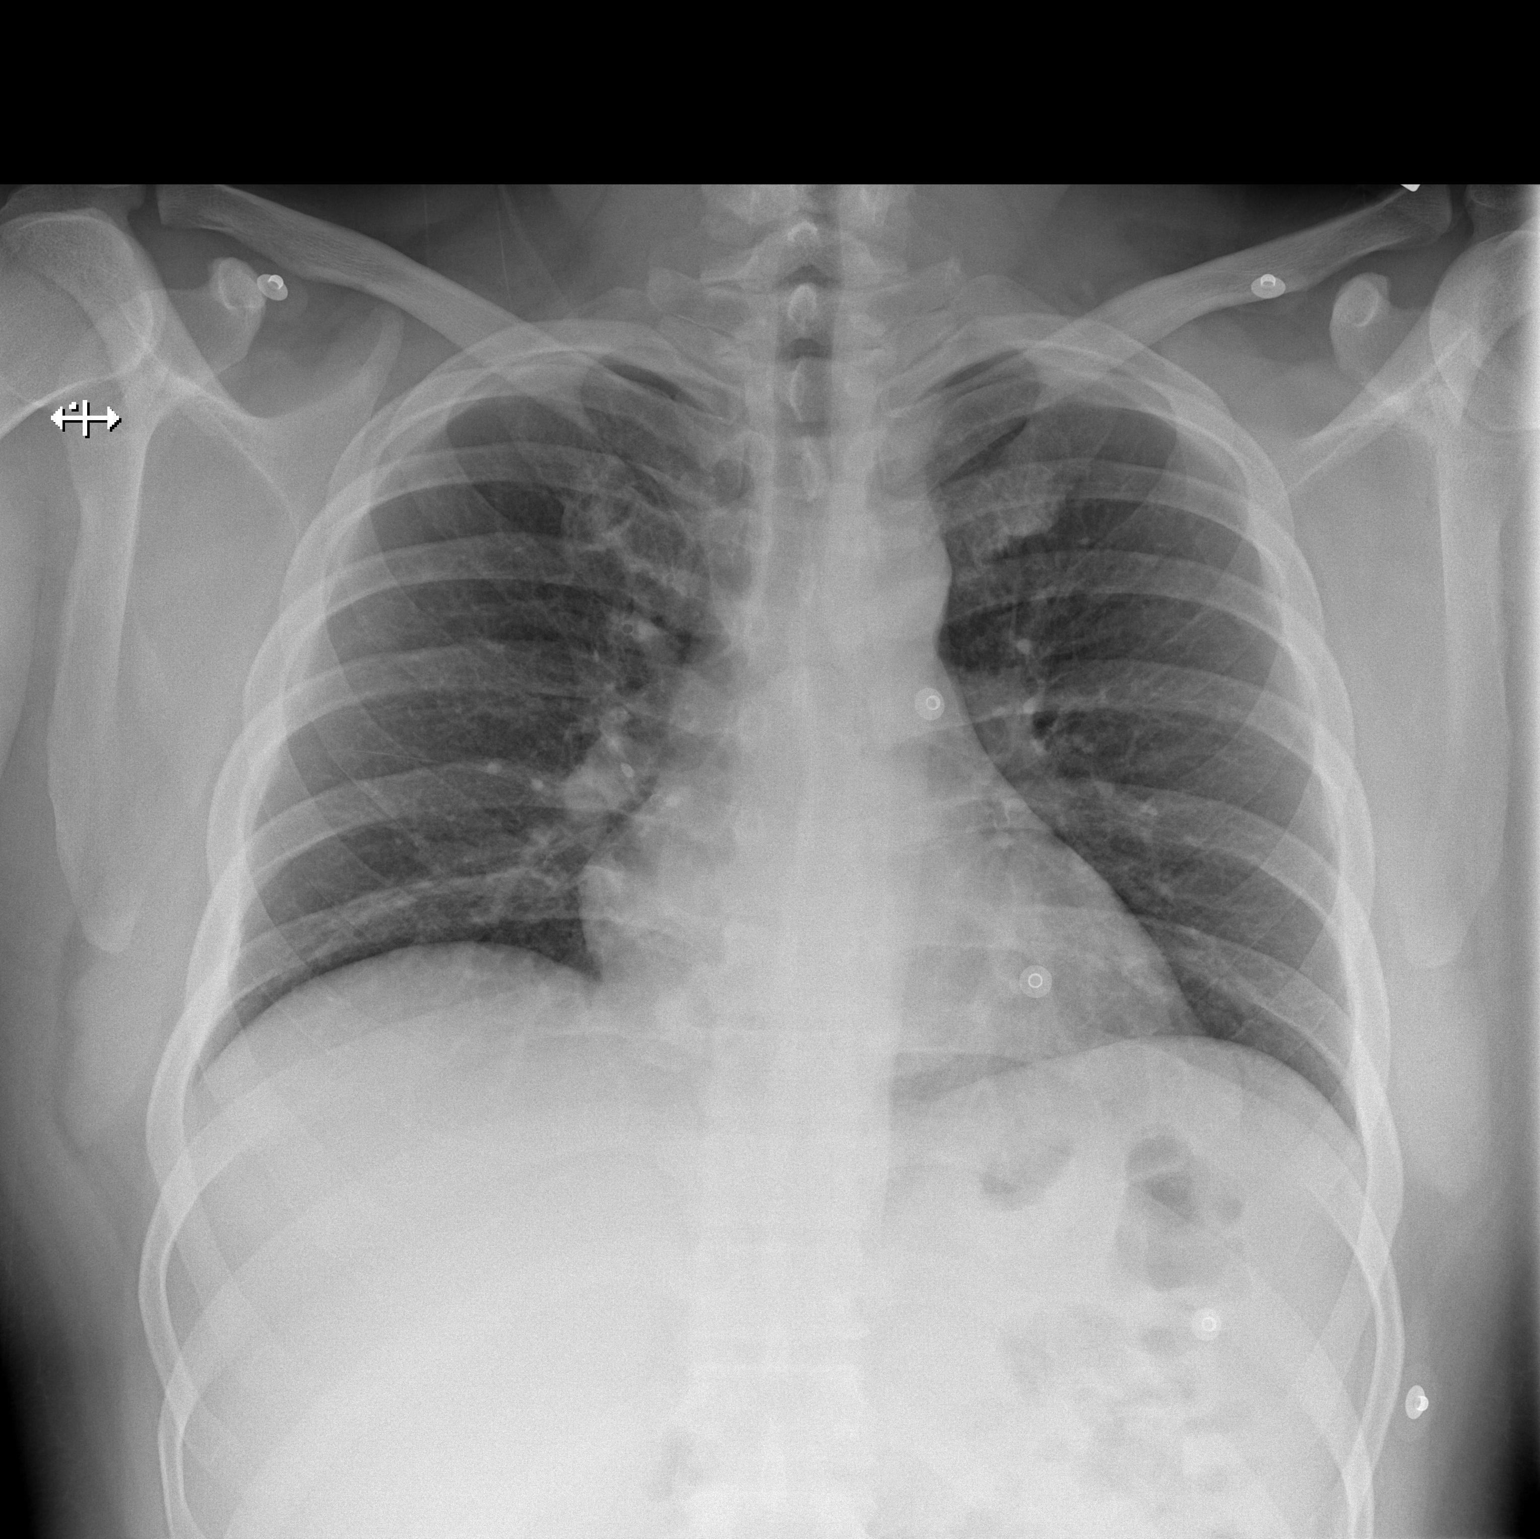

[w chest lat]
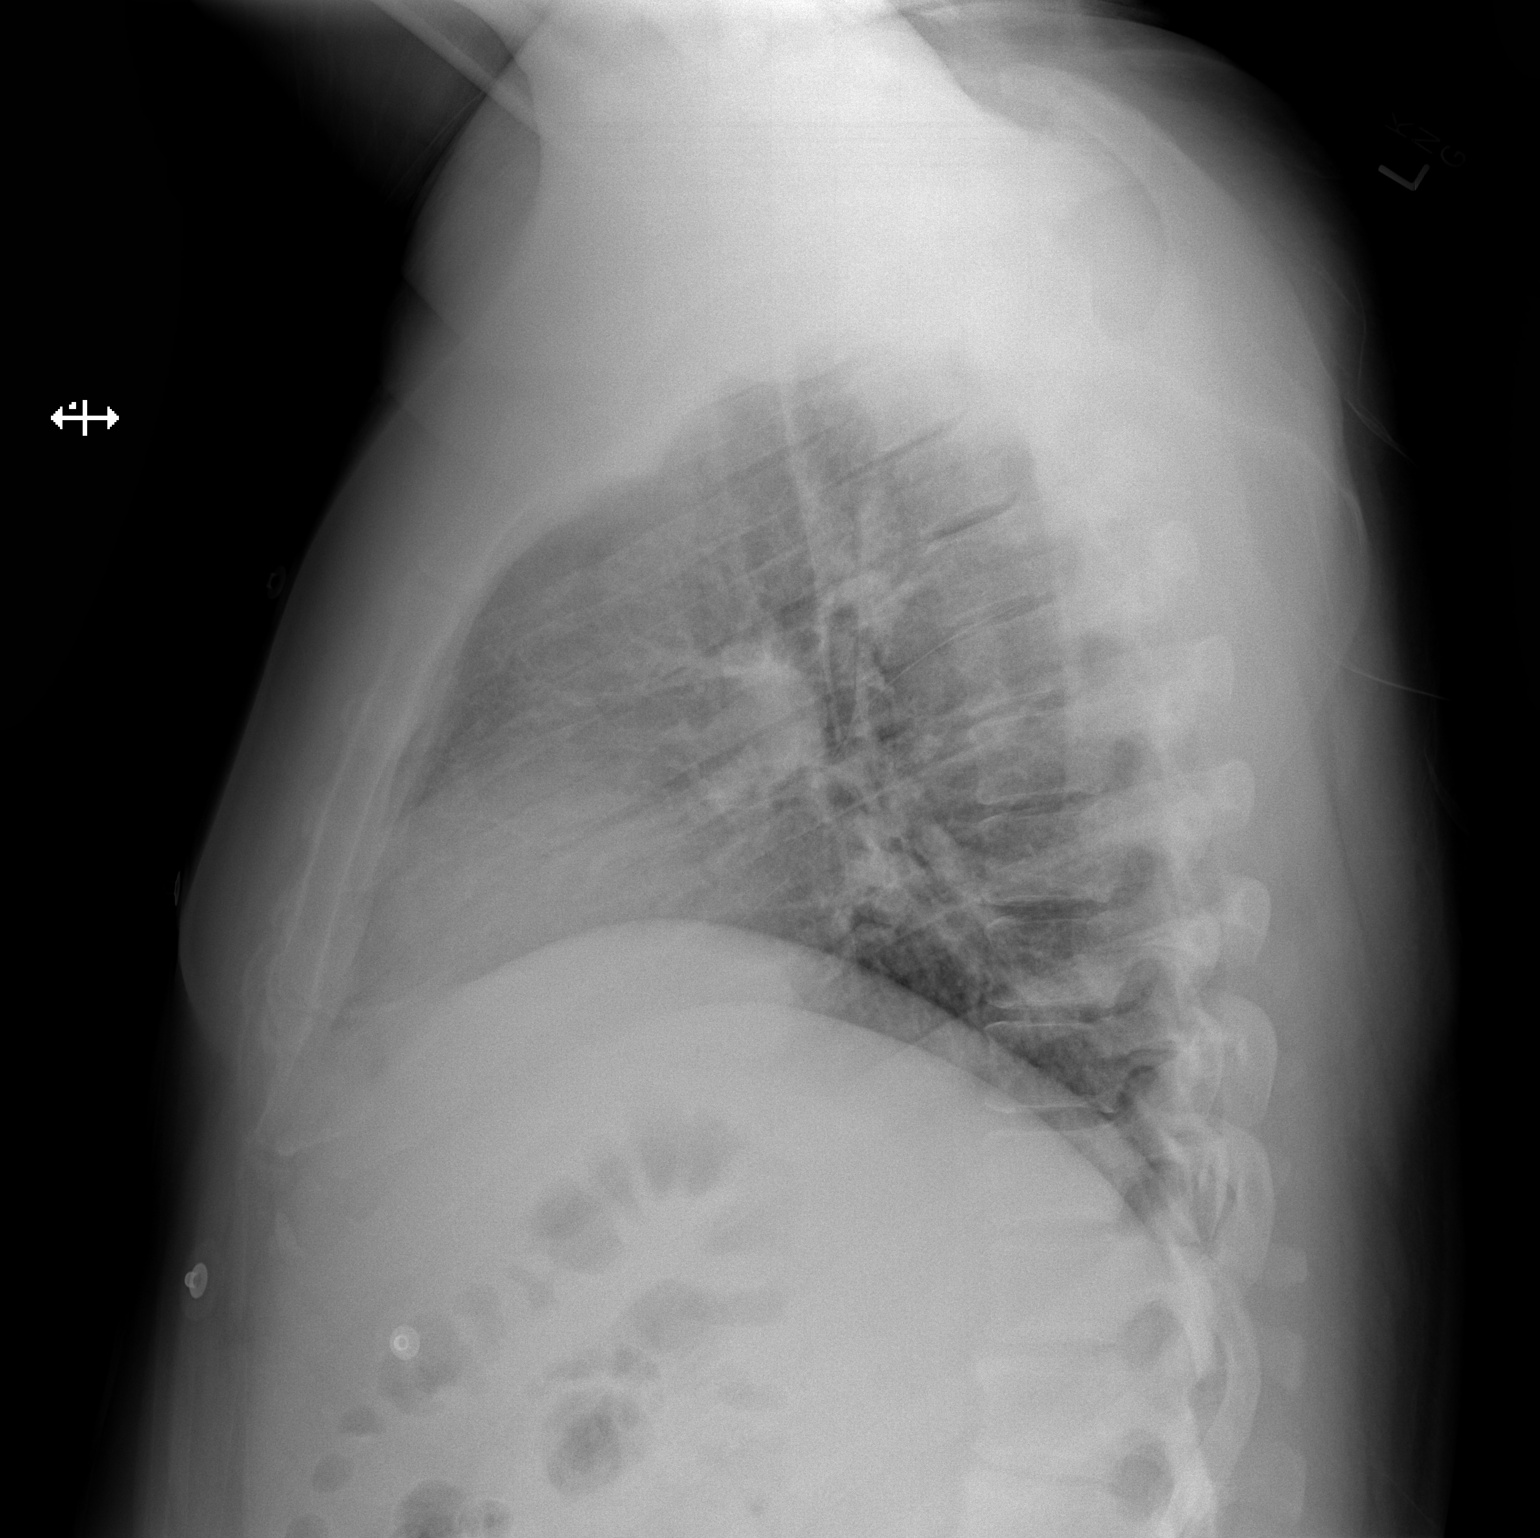

[2 of 2 positions shown; findings below may reference images not displayed]

FINDINGS: Suboptimal inspiration accounts for crowded bronchovascular
markings, especially in the bases, and accentuates the cardiac
silhouette. Taking this into account, cardiomediastinal silhouette
unremarkable. Lungs clear. Bronchovascular markings normal.
Pulmonary vascularity normal. No visible pleural effusions. No
pneumothorax. Visualized bony thorax intact.
IMPRESSION: Suboptimal inspiration.  No acute cardiopulmonary disease.

## 2016-12-12 DIAGNOSIS — J4540 Moderate persistent asthma, uncomplicated: Secondary | ICD-10-CM | POA: Insufficient documentation

## 2016-12-12 DIAGNOSIS — Z79899 Other long term (current) drug therapy: Secondary | ICD-10-CM | POA: Insufficient documentation

## 2016-12-13 ENCOUNTER — Emergency Department (HOSPITAL_COMMUNITY)
Admission: EM | Admit: 2016-12-13 | Discharge: 2016-12-13 | Disposition: A | Discharge status: Home / Self Care | Payer: Self-pay | Attending: Emergency Medicine | Admitting: Emergency Medicine

## 2016-12-13 ENCOUNTER — Emergency Department (HOSPITAL_COMMUNITY): Payer: Self-pay

## 2016-12-13 ENCOUNTER — Encounter (HOSPITAL_COMMUNITY): Payer: Self-pay | Admitting: Family Medicine

## 2016-12-13 DIAGNOSIS — J4540 Moderate persistent asthma, uncomplicated: Secondary | ICD-10-CM

## 2016-12-13 MED ORDER — ALBUTEROL SULFATE (2.5 MG/3ML) 0.083% IN NEBU
5.0000 mg | INHALATION_SOLUTION | Freq: Once | RESPIRATORY_TRACT | Status: AC
Start: 2016-12-13 — End: 2016-12-13
  Administered 2016-12-13: 5 mg via RESPIRATORY_TRACT
  Filled 2016-12-13: qty 6

## 2016-12-13 MED ORDER — METHYLPREDNISOLONE SODIUM SUCC 125 MG IJ SOLR
125.0000 mg | Freq: Once | INTRAMUSCULAR | Status: AC
  Administered 2016-12-13: 125 mg via INTRAVENOUS
  Filled 2016-12-13: qty 2

## 2016-12-13 MED ORDER — ALBUTEROL SULFATE HFA 108 (90 BASE) MCG/ACT IN AERS
2.0000 | INHALATION_SPRAY | Freq: Once | RESPIRATORY_TRACT | Status: DC
  Filled 2016-12-13: qty 6.7

## 2016-12-13 MED ORDER — IPRATROPIUM-ALBUTEROL 0.5-2.5 (3) MG/3ML IN SOLN
3.0000 mL | Freq: Once | RESPIRATORY_TRACT | Status: AC
  Administered 2016-12-13: 3 mL via RESPIRATORY_TRACT
  Filled 2016-12-13: qty 3

## 2016-12-13 MED ORDER — PREDNISONE 10 MG PO TABS: 20.0000 mg | ORAL_TABLET | Freq: Two times a day (BID) | ORAL | 0 refills | Status: DC

## 2016-12-13 NOTE — ED Triage Notes (Signed)
Patient reports he started experiencing shortness of breath with coughing yesterday. Tonight, he developed dizziness. Patient reports he took ALBUTEROL nebulizer and Prednisone.

## 2016-12-13 NOTE — ED Provider Notes (Signed)
[facility]-EMERGENCY DEPT Provider Note   CSN: [id number] Arrival date & time: 12/12/16  2342  By signing my name below, I, Eric Sanford, attest that this documentation has been prepared under the direction and in the presence of Geoffery Lyons, MD. Electronically Signed: Rosario Sanford, ED Scribe. 12/13/16. 1:27 AM.  History   Chief Complaint Chief Complaint  Patient presents with  . Shortness of Breath  . Dizziness   The history is provided by the patient. No language interpreter was used.  Shortness of Breath  This is a new problem. The average episode lasts 3 days. The problem occurs intermittently.The current episode started more than 2 days ago. The problem has been gradually worsening. Associated symptoms include cough and chest pain. Pertinent negatives include no fever and no vomiting. It is unknown what precipitated the problem. He has tried beta-agonist inhalers for the symptoms. He has had no prior hospitalizations. He has had no prior ED visits. He has had no prior ICU admissions. Associated medical issues do not include asthma, COPD, pneumonia, chronic lung disease, PE, CAD, heart failure, past MI, DVT or recent surgery.    HPI Comments: Eric Sanford is a 41 y.o. male with no pertinent PMHx, who presents to the Emergency Department complaining of intermittent episodes of shortness of breath beginning three days ago, worsening since tonight prior to arrival. Pt reports that he has been short of breath while laying in bed at night; however, tonight his shortness of breath has been worse than usual, persistent, and it has been accompanied by dizziness, diaphoresis, and left-sided chest pain. No h/o of similar symptoms. Pt took Prednisone, used an Albuterol nebulizer at home, and had an albuterol breathing treatment while in the ED with moderate relief, but notes that this has been worsening. Pt notes that he additionally has had a dry cough and congestion over the past 3-4 days  as well. No h/o asthma or COPD. No recent illness/infections. He is a non-smoker. Pt denies fever, nausea, vomiting, or any other associated symptoms.   Past Medical History:  Diagnosis Date  . Gout    There are no active problems to display for this patient.  History reviewed. No pertinent surgical history.  Home Medications    Prior to Admission medications   Medication Sig Start Date End Date Taking? Authorizing Provider  HYDROcodone-acetaminophen (NORCO/VICODIN) 5-325 MG per tablet Take 1-2 tablets by mouth every 4 (four) hours as needed for moderate pain or severe pain. 08/24/14   Tiffany Neva Seat, PA-C  ibuprofen (ADVIL,MOTRIN) 200 MG tablet Take 400 mg by mouth every 4 (four) hours as needed for moderate pain.    Historical Provider, MD  indomethacin (INDOCIN) 25 MG capsule Take 1 capsule (25 mg total) by mouth 3 (three) times daily as needed. 08/24/14   Marlon Pel, PA-C   Family History History reviewed. No pertinent family history.  Social History Social History  Substance Use Topics  . Smoking status: Never Smoker  . Smokeless tobacco: Never Used  . Alcohol use Yes     Comment: Everyother weekend    Allergies   Patient has no known allergies.  Review of Systems Review of Systems  Constitutional: Positive for diaphoresis. Negative for fever.  HENT: Positive for congestion.   Respiratory: Positive for cough and shortness of breath.   Cardiovascular: Positive for chest pain.  Gastrointestinal: Negative for nausea and vomiting.  Neurological: Positive for dizziness.  All other systems reviewed and are negative.  Physical Exam Updated Vital Signs  BP 133/93   Pulse 107   Temp 98 F (36.7 C) (Oral)   Resp 20   Ht 5\' 11"  (1.803 m)   Wt 260 lb (117.9 kg)   SpO2 92%   BMI 36.26 kg/m   Physical Exam  Constitutional: He is oriented to person, place, and time. He appears well-developed and well-nourished.  HENT:  Head: Normocephalic and atraumatic.  Eyes: EOM  are normal.  Neck: Normal range of motion.  Cardiovascular: Normal rate, regular rhythm, normal heart sounds and intact distal pulses.   Pulmonary/Chest: Effort normal. No respiratory distress. He has wheezes.  Expiratory wheezes bilaterally.   Abdominal: Soft. He exhibits no distension. There is no tenderness.  Musculoskeletal: Normal range of motion.  Neurological: He is alert and oriented to person, place, and time.  Skin: Skin is warm and dry.  Psychiatric: He has a normal mood and affect. Judgment normal.  Nursing note and vitals reviewed.  ED Treatments / Results  DIAGNOSTIC STUDIES: Oxygen Saturation is 96% on RA, normal by my interpretation.   COORDINATION OF CARE: 1:26 AM-Discussed next steps with pt. Pt verbalized understanding and is agreeable with the plan.   Labs (all labs ordered are listed, but only abnormal results are displayed) Labs Reviewed - No data to display  EKG  EKG Interpretation None      Radiology Dg Chest 2 View  Result Date: 12/13/2016 CLINICAL DATA:  Short of breath EXAM: CHEST  2 VIEW COMPARISON:  09/08/2015 FINDINGS: Low lung volumes. No focal consolidation or effusion. Normal heart size. No pneumothorax. IMPRESSION: Low lung volumes.  No focal infiltrate or edema Electronically Signed   By: Jasmine PangKim  Fujinaga M.D.   On: 12/13/2016 00:57   Procedures Procedures   Medications Ordered in ED Medications  methylPREDNISolone sodium succinate (SOLU-MEDROL) 125 mg/2 mL injection 125 mg (not administered)  albuterol (PROVENTIL) (2.5 MG/3ML) 0.083% nebulizer solution 5 mg (5 mg Nebulization Given 12/13/16 0040)  ipratropium-albuterol (DUONEB) 0.5-2.5 (3) MG/3ML nebulizer solution 3 mL (3 mLs Nebulization Given 12/13/16 0130)   Initial Impression / Assessment and Plan / ED Course  I have reviewed the triage vital signs and the nursing notes.  Pertinent labs & imaging results that were available during my care of the patient were reviewed by me and  considered in my medical decision making (see chart for details).  Patient presents with wheezing and difficulty breathing. He was given an IV steroid and 2 nebulizer treatments and is feeling better. His chest x-ray reveals no evidence for pneumonia. While I was evaluating him, his oxygen saturations were 96-98% with an accurate waveform. He will be discharged with prednisone and an albuterol MDI.  Final Clinical Impressions(s) / ED Diagnoses   Final diagnoses:  None   New Prescriptions New Prescriptions   No medications on file   I personally performed the services described in this documentation, which was scribed in my presence. The recorded information has been reviewed and is accurate.       Geoffery Lyonsouglas Chucky Homes, MD 12/13/16 [phone]

## 2016-12-13 NOTE — Discharge Instructions (Signed)
Prednisone as prescribed.  Albuterol inhaler: 2 puffs every 4 hours as needed for wheezing.  Return to the emergency department if he developed worsening wheezing, chest pain, high fevers, or other new and concerning symptoms.

## 2016-12-26 ENCOUNTER — Emergency Department (HOSPITAL_COMMUNITY)
Admission: EM | Admit: 2016-12-26 | Discharge: 2016-12-26 | Disposition: A | Discharge status: Home / Self Care | Payer: Self-pay | Attending: Emergency Medicine | Admitting: Emergency Medicine

## 2016-12-26 ENCOUNTER — Encounter (HOSPITAL_COMMUNITY): Payer: Self-pay | Admitting: Emergency Medicine

## 2016-12-26 DIAGNOSIS — J0100 Acute maxillary sinusitis, unspecified: Secondary | ICD-10-CM

## 2016-12-26 DIAGNOSIS — H6502 Acute serous otitis media, left ear: Secondary | ICD-10-CM | POA: Insufficient documentation

## 2016-12-26 DIAGNOSIS — J45909 Unspecified asthma, uncomplicated: Secondary | ICD-10-CM | POA: Insufficient documentation

## 2016-12-26 DIAGNOSIS — J320 Chronic maxillary sinusitis: Secondary | ICD-10-CM | POA: Insufficient documentation

## 2016-12-26 MED ORDER — PROMETHAZINE-DM 6.25-15 MG/5ML PO SYRP: 5.0000 mL | ORAL_SOLUTION | Freq: Four times a day (QID) | ORAL | 0 refills | Status: DC | PRN

## 2016-12-26 MED ORDER — AMOXICILLIN-POT CLAVULANATE 875-125 MG PO TABS: 1.0000 | ORAL_TABLET | Freq: Two times a day (BID) | ORAL | 0 refills | Status: DC

## 2016-12-26 NOTE — ED Triage Notes (Signed)
Cough and hoarse x 1 week states [facility] but is not better, sputum is green and sinus is green

## 2016-12-26 NOTE — ED Provider Notes (Signed)
[facility]-EMERGENCY DEPT Provider Note   CSN: [id number] Arrival date & time: 12/26/16  [id number]  By signing my name below, I, Teofilo PodMatthew P. Jamison, attest that this documentation has been prepared under the direction and in the presence of Fayrene HelperBowie Jakori Burkett, PA-C. Electronically Signed: Teofilo PodMatthew P. Jamison, ED Scribe. 12/26/2016. 10:16 AM.    History   Chief Complaint Chief Complaint  Patient presents with  . Cough  . Facial Pain    The history is provided by the patient. No language interpreter was used.   HPI Comments:  Eric Sanford is a 41 y.o. male who presents to the Emergency Department complaining of a persistent cough x ~1 month. He states that the cough is productive with green sputum. Pt complains of associated chest pain, nasal congestion, green rhinorrhea, subjective fever, generalized body aches, chills, and posttussive emesis. [facility] and was diagnosed with asthma and treated with albuterol and had a negative chest xray. Pt is not a smoker, and denies hx of DVT/PE, denies long travel, and denies recent surgery. Pt has taken benadryl and robitussin with no relief. Pt denies leg swelling.   Past Medical History:  Diagnosis Date  . Gout     There are no active problems to display for this patient.   History reviewed. No pertinent surgical history.     Home Medications    Prior to Admission medications   Medication Sig Start Date End Date Taking? Authorizing Provider  DM-Doxylamine-Acetaminophen 30-12.03-999 MG/30ML LIQD Take 30 mLs by mouth at bedtime as needed (for cough).    Historical Provider, MD  predniSONE (DELTASONE) 10 MG tablet Take 2 tablets (20 mg total) by mouth 2 (two) times daily with a meal. 12/13/16   Geoffery Lyonsouglas Delo, MD    Family History No family history on file.  Social History Social History  Substance Use Topics  . Smoking status: Never Smoker  . Smokeless tobacco: Never Used  . Alcohol use Yes     Comment: Everyother  weekend      Allergies   Patient has no known allergies.   Review of Systems Review of Systems  Constitutional: Positive for chills.  HENT: Positive for congestion and rhinorrhea.   Respiratory: Positive for cough.   Cardiovascular: Positive for chest pain. Negative for leg swelling.  Gastrointestinal: Positive for vomiting.  Musculoskeletal: Positive for myalgias.     Physical Exam Updated Vital Signs BP 126/90 (BP Location: Right Arm)   Pulse 72   Temp 98.4 F (36.9 C) (Oral)   Resp 18   Ht 5\' 11"  (1.803 m)   Wt 260 lb (117.9 kg)   SpO2 98%   BMI 36.26 kg/m   Physical Exam  Constitutional: He appears well-developed and well-nourished. No distress.  HENT:  Head: Normocephalic and atraumatic.  Right Ear: Tympanic membrane normal.  Left Ear: Tympanic membrane is erythematous.  Mouth/Throat: Uvula is midline, oropharynx is clear and moist and mucous membranes are normal. No trismus in the jaw. No uvula swelling. No tonsillar exudate.  Eyes: Conjunctivae are normal.  Neck: No tracheal deviation present.  Cardiovascular: Normal rate.   Pulmonary/Chest: Effort normal. He has wheezes (faint expiratory on right lower lung). He has no rhonchi. He has no rales.  Abdominal: He exhibits no distension.  Neurological: He is alert.  Skin: Skin is warm and dry.  Psychiatric: He has a normal mood and affect.  Nursing note and vitals reviewed.    ED Treatments / Results  DIAGNOSTIC STUDIES:  Oxygen Saturation is 98% on RA, normal by my interpretation.    COORDINATION OF CARE:  10:15 AM Will prescribe antibiotics. Discussed treatment plan with pt at bedside and pt agreed to plan.   Labs (all labs ordered are listed, but only abnormal results are displayed) Labs Reviewed - No data to display  EKG  EKG Interpretation None       Radiology No results found.  Procedures Procedures (including critical care time)  Medications Ordered in ED Medications - No data to  display   Initial Impression / Assessment and Plan / ED Course  I have reviewed the triage vital signs and the nursing notes.  Pertinent labs & imaging results that were available during my care of the patient were reviewed by me and considered in my medical decision making (see chart for details).     BP 126/90 (BP Location: Right Arm)   Pulse 72   Temp 98.4 F (36.9 C) (Oral)   Resp 18   Ht 5\' 11"  (1.803 m)   Wt 117.9 kg   SpO2 98%   BMI 36.26 kg/m    Final Clinical Impressions(s) / ED Diagnoses   Final diagnoses:  Acute serous otitis media of left ear, recurrence not specified  Subacute maxillary sinusitis    New Prescriptions New Prescriptions   AMOXICILLIN-CLAVULANATE (AUGMENTIN) 875-125 MG TABLET    Take 1 tablet by mouth 2 (two) times daily. One po bid x 7 days   PROMETHAZINE-DEXTROMETHORPHAN (PROMETHAZINE-DM) 6.25-15 MG/5ML SYRUP    Take 5 mLs by mouth 4 (four) times daily as needed for cough.   I personally performed the services described in this documentation, which was scribed in my presence. The recorded information has been reviewed and is accurate.   10:21 AM Pt with sxs suggestsive of viral infection.  However L TM is erythematous and opaque with report pain and discomfort, plus pt has sxs >24month.  Suspect superimposed bacterial infection therefore will prescribe augmentin to cover otitis media and sinusitis.  outpt f/u recommended.     Fayrene Helper, PA-C 12/26/16 1022    Marily Memos, MD 12/26/16 1459

## 2020-10-01 ENCOUNTER — Emergency Department (HOSPITAL_COMMUNITY)
Admission: EM | Admit: 2020-10-01 | Discharge: 2020-10-01 | Disposition: A | Discharge status: Home / Self Care | Payer: Self-pay | Attending: Emergency Medicine | Admitting: Emergency Medicine

## 2020-10-01 ENCOUNTER — Encounter (HOSPITAL_COMMUNITY): Payer: Self-pay | Admitting: Emergency Medicine

## 2020-10-01 ENCOUNTER — Other Ambulatory Visit: Payer: Self-pay

## 2020-10-01 DIAGNOSIS — Y990 Civilian activity done for income or pay: Secondary | ICD-10-CM | POA: Insufficient documentation

## 2020-10-01 DIAGNOSIS — T1592XA Foreign body on external eye, part unspecified, left eye, initial encounter: Secondary | ICD-10-CM | POA: Insufficient documentation

## 2020-10-01 DIAGNOSIS — W311XXA Contact with metalworking machines, initial encounter: Secondary | ICD-10-CM | POA: Insufficient documentation

## 2020-10-01 DIAGNOSIS — S0502XA Injury of conjunctiva and corneal abrasion without foreign body, left eye, initial encounter: Secondary | ICD-10-CM | POA: Insufficient documentation

## 2020-10-01 MED ORDER — OFLOXACIN 0.3 % OP SOLN: 2.0000 [drp] | Freq: Four times a day (QID) | OPHTHALMIC | 0 refills | Status: DC

## 2020-10-01 MED ORDER — TETRACAINE HCL 0.5 % OP SOLN
2.0000 [drp] | Freq: Once | OPHTHALMIC | Status: AC
  Administered 2020-10-01: 2 [drp] via OPHTHALMIC
  Filled 2020-10-01: qty 4

## 2020-10-01 MED ORDER — FLUORESCEIN SODIUM 1 MG OP STRP
1.0000 | ORAL_STRIP | Freq: Once | OPHTHALMIC | Status: AC
  Administered 2020-10-01: 1 via OPHTHALMIC
  Filled 2020-10-01: qty 1

## 2020-10-01 NOTE — Discharge Instructions (Addendum)
You were seen in the ED for left eye injury during work  You have a very small abrasion or scratch of your cornea  This is treated with antibiotic eyedrops for 5 days  Place 2 drops of antibiotic eyedrops in the left eye every 6 hours for a total of 5 days  You may use over-the-counter rewetting drops if you feel like your eye is dry or irritated  Wear protective eye glasses during work to prevent another injury  Return to the ED for severe eye pain, eye swelling, discharge, fevers  Your symptoms should improve in the next 2 to 3 days.  Call ophthalmologist (eye doctor) and make an appointment in his office for reevaluation in 3 days if your symptoms have not resolved

## 2020-10-01 NOTE — ED Triage Notes (Signed)
Patient reports piece of metal to left eye at work today. Reports blurred vision to left eye.

## 2020-10-01 NOTE — ED Provider Notes (Signed)
[facility]-EMERGENCY DEPT Provider Note   CSN: [id number] Arrival date & time: 10/01/20  1357     History Chief Complaint  Patient presents with   Foreign Body in Eye    Eric Sanford is a 44 y.o. male presents to the ED for evaluation of work-related injury earlier today.  Patient was grinding metal and felt sudden foreign body sensation in the left eye, thinks a piece of metal flew into his eye.  Reports left eye foreign body sensation, slight blurred vision and redness.  He also noticed a very small brown speck in his left eye on the surface of the iris.  Prior to my evaluation patient states he went to the bathroom in the ED and rinsed his eye with tap water.  States when he rechecked he no longer saw the small speck/foreign body in his eye and thinks he got it out after rinsing his eye.  Still reports blurred vision and slight redness.  No significant eye pain, headache.  No tearing.  Does not work contacts or corrective glasses.  Patient states he was not wearing protective eye wear while at work. No previous eye surgeries.   HPI     Past Medical History:  Diagnosis Date   Gout     There are no problems to display for this patient.   History reviewed. No pertinent surgical history.     No family history on file.  Social History   Tobacco Use   Smoking status: Never Smoker   Smokeless tobacco: Never Used  Substance Use Topics   Alcohol use: Yes    Comment: Everyother weekend    Drug use: No    Home Medications Prior to Admission medications   Medication Sig Start Date End Date Taking? Authorizing Provider  amoxicillin-clavulanate (AUGMENTIN) 875-125 MG tablet Take 1 tablet by mouth 2 (two) times daily. One po bid x 7 days 12/26/16   Fayrene Helper, PA-C  DM-Doxylamine-Acetaminophen 30-12.03-999 MG/30ML LIQD Take 30 mLs by mouth at bedtime as needed (for cough).    [provider]  ofloxacin (OCUFLOX) 0.3 % ophthalmic solution  Place 2 drops into the left eye 4 (four) times daily. 2 drops in left eye four times daily (every 6 hours) for 5 days 10/01/20   Liberty Handy, PA-C  predniSONE (DELTASONE) 10 MG tablet Take 2 tablets (20 mg total) by mouth 2 (two) times daily with a meal. 12/13/16   Geoffery Lyons, MD  promethazine-dextromethorphan (PROMETHAZINE-DM) 6.25-15 MG/5ML syrup Take 5 mLs by mouth 4 (four) times daily as needed for cough. 12/26/16   Fayrene Helper, PA-C    Allergies    Patient has no known allergies.  Review of Systems   Review of Systems  Eyes: Positive for redness and visual disturbance.       Foreign body sensation   All other systems reviewed and are negative.   Physical Exam Updated Vital Signs BP (!) 147/104    Pulse 82    Temp 98.2 F (36.8 C) (Oral)    Resp 19    SpO2 99%   Physical Exam Constitutional:      Appearance: He is well-developed.  HENT:     Head: Normocephalic.     Nose: Nose normal.  Eyes:     General: Lids are normal.     Comments: Visual acuity 20/40 bilateral, 20/50 (left) 20/40 (right)  LEFT EYE: PERRL.  No visual field deficit. No consensual or direct photosensitivity. EOMs intact, painless. Upper/lower  lids without erythema, edema, tenderness, warmth, palpable mass, lesions.  Temporal and nasal conjunctiva and sclera slightly red with prominent vessels. IOP 20. Fluorescein uptake: small punctuate over iris.  RIGHT EYE: PERRL. No consensual photosensitivity. EOMs intact, painless. Upper/lower lids without erythema, edema, tenderness, warmth, palpable mass, lesions.  Conjunctiva and sclera white without prominent vessels.   Cardiovascular:     Rate and Rhythm: Normal rate.  Pulmonary:     Effort: Pulmonary effort is normal. No respiratory distress.  Musculoskeletal:        General: Normal range of motion.     Cervical back: Normal range of motion.  Neurological:     Mental Status: He is alert.  Psychiatric:        Behavior: Behavior normal.       ED  Results / Procedures / Treatments   Labs (all labs ordered are listed, but only abnormal results are displayed) Labs Reviewed - No data to display  EKG None  Radiology No results found.  Procedures Procedures (including critical care time)  Medications Ordered in ED Medications  tetracaine (PONTOCAINE) 0.5 % ophthalmic solution 2 drop (2 drops Left Eye Given by Other 10/01/20 1559)  fluorescein ophthalmic strip 1 strip (1 strip Left Eye Given by Other 10/01/20 1559)    ED Course  I have reviewed the triage vital signs and the nursing notes.  Pertinent labs & imaging results that were available during my care of the patient were reviewed by me and considered in my medical decision making (see chart for details).    MDM Rules/Calculators/A&P                          44 year old male presents for work-related injury and possible metal foreign body in the left eye.  By the time I evaluated patient he had rinsed his left eye with water and states he no longer sees the small speck of possible metal in the left eye.  No pain but reports foreign body sensation and blurred vision.  EMR, triage or nurse notes reviewed to assist with history and MDM.  Visual acuity done by RN 20/40 bilateral, 20/40 right eye, 20/50 left/affected eye.  Exam reveals punctate fluorescein uptake over the left iris where patient states he saw the foreign body prior to rinsing his eye.  IOP 20.  Eye exam otherwise benign.  DDx includes small corneal abrasion from foreign body.  No concern for other eye emergencies like CRE O/CRV O, optic neuritis, retinal detachment, globe injury, glaucoma, cellulitis, endophthalmitis, iritis or conjunctivitis.  I do not visualize any foreign bodies in the eye.  We will discharge with ofloxacin eyedrops, rewetting eyedrops, sun protection.  Patient instructed that symptoms should improve in the next 48 hours, given ophthalmology contact information and recommended follow-up in 2  to 3 days if symptoms have not improved.  Return precautions discussed.  Patient is comfortable with plan. Final Clinical Impression(s) / ED Diagnoses Final diagnoses:  Abrasion of left cornea, initial encounter  Work related injury    Rx / DC Orders ED Discharge Orders         Ordered    ofloxacin (OCUFLOX) 0.3 % ophthalmic solution  4 times daily        10/01/20 1549           Jerrell Mylar 10/01/20 1616    Benjiman Core, MD 10/01/20 2319

## 2021-05-17 ENCOUNTER — Emergency Department (HOSPITAL_COMMUNITY): Payer: Self-pay

## 2021-05-17 ENCOUNTER — Emergency Department (HOSPITAL_COMMUNITY)
Admission: EM | Admit: 2021-05-17 | Discharge: 2021-05-17 | Disposition: A | Discharge status: Home / Self Care | Payer: Self-pay | Attending: Emergency Medicine | Admitting: Emergency Medicine

## 2021-05-17 ENCOUNTER — Encounter (HOSPITAL_COMMUNITY): Payer: Self-pay

## 2021-05-17 ENCOUNTER — Other Ambulatory Visit: Payer: Self-pay

## 2021-05-17 DIAGNOSIS — R072 Precordial pain: Secondary | ICD-10-CM

## 2021-05-17 DIAGNOSIS — H9312 Tinnitus, left ear: Secondary | ICD-10-CM | POA: Insufficient documentation

## 2021-05-17 DIAGNOSIS — R42 Dizziness and giddiness: Secondary | ICD-10-CM | POA: Insufficient documentation

## 2021-05-17 DIAGNOSIS — R109 Unspecified abdominal pain: Secondary | ICD-10-CM | POA: Insufficient documentation

## 2021-05-17 DIAGNOSIS — I10 Essential (primary) hypertension: Secondary | ICD-10-CM | POA: Insufficient documentation

## 2021-05-17 LAB — BASIC METABOLIC PANEL
Anion gap: 7 (ref 5–15)
BUN: 14 mg/dL (ref 6–20)
CO2: 26 mmol/L (ref 22–32)
Calcium: 9.4 mg/dL (ref 8.9–10.3)
Chloride: 105 mmol/L (ref 98–111)
Creatinine, Ser: 0.74 mg/dL (ref 0.61–1.24)
GFR, Estimated: >60 mL/min (ref >60)
Glucose, Bld: 142 mg/dL — ABNORMAL HIGH (ref 70–99)
Potassium: 3.9 mmol/L (ref 3.5–5.1)
Sodium: 138 mmol/L (ref 135–145)

## 2021-05-17 LAB — URINALYSIS, ROUTINE W REFLEX MICROSCOPIC
Bilirubin Urine: NEGATIVE
Glucose, UA: NEGATIVE mg/dL
Hgb urine dipstick: NEGATIVE
Ketones, ur: NEGATIVE mg/dL
Leukocytes,Ua: NEGATIVE
Nitrite: NEGATIVE
Protein, ur: NEGATIVE mg/dL
Specific Gravity, Urine: 1.006 (ref 1.005–1.030)
pH: 6 (ref 5.0–8.0)

## 2021-05-17 LAB — CBC
HCT: 44 % (ref 39.0–52.0)
Hemoglobin: 15.4 g/dL (ref 13.0–17.0)
MCH: 32.4 pg (ref 26.0–34.0)
MCHC: 35 g/dL (ref 30.0–36.0)
MCV: 92.4 fL (ref 80.0–100.0)
Platelets: 224 10*3/uL (ref 150–400)
RBC: 4.76 MIL/uL (ref 4.22–5.81)
RDW: 11.7 % (ref 11.5–15.5)
WBC: 6.5 10*3/uL (ref 4.0–10.5)
nRBC: 0 % (ref 0.0–0.2)

## 2021-05-17 LAB — TROPONIN I (HIGH SENSITIVITY): Troponin I (High Sensitivity): <2 ng/L (ref <18)

## 2021-05-17 NOTE — ED Provider Notes (Addendum)
[facility]-EMERGENCY DEPT Provider Note   CSN: [id number] Arrival date & time: 05/17/21  7858     History Chief Complaint  Patient presents with   Hypertension   Dizziness   Chest Pain   Tinnitus    Eric Sanford is a 45 y.o. male.  Patient coming in with several complaints.  Eric Sanford has been off his blood pressure medicine now for several weeks.  But did get a refill and started taking it today.  Eric Sanford is on enalapril.  Patient presenting with a 2-week history of anterior chest pain blood pressure being high dizziness but not room spinning and ringing in the left ear.  Patient currently does not have a primary care doctor.  His primary care doctor moved.  Also with a complaint of right-sided flank pain.  Denies any hematuria.      Past Medical History:  Diagnosis Date   Gout     There are no problems to display for this patient.   History reviewed. No pertinent surgical history.     History reviewed. No pertinent family history.  Social History   Tobacco Use   Smoking status: Never   Smokeless tobacco: Never  Substance Use Topics   Alcohol use: Yes    Comment: Everyother weekend    Drug use: No    Home Medications Prior to Admission medications   Medication Sig Start Date End Date Taking? Authorizing Provider  amoxicillin-clavulanate (AUGMENTIN) 875-125 MG tablet Take 1 tablet by mouth 2 (two) times daily. One po bid x 7 days 12/26/16   Fayrene Helper, PA-C  DM-Doxylamine-Acetaminophen 30-12.03-999 MG/30ML LIQD Take 30 mLs by mouth at bedtime as needed (for cough).    [provider]  ofloxacin (OCUFLOX) 0.3 % ophthalmic solution Place 2 drops into the left eye 4 (four) times daily. 2 drops in left eye four times daily (every 6 hours) for 5 days 10/01/20   Liberty Handy, PA-C  predniSONE (DELTASONE) 10 MG tablet Take 2 tablets (20 mg total) by mouth 2 (two) times daily with a meal. 12/13/16   Geoffery Lyons, MD   promethazine-dextromethorphan (PROMETHAZINE-DM) 6.25-15 MG/5ML syrup Take 5 mLs by mouth 4 (four) times daily as needed for cough. 12/26/16   Fayrene Helper, PA-C    Allergies    Patient has no known allergies.  Review of Systems   Review of Systems  Constitutional:  Negative for chills and fever.  HENT:  Positive for tinnitus. Negative for ear pain and sore throat.   Eyes:  Negative for pain and visual disturbance.  Respiratory:  Negative for cough and shortness of breath.   Cardiovascular:  Positive for chest pain. Negative for palpitations.  Gastrointestinal:  Negative for abdominal pain and vomiting.  Genitourinary:  Positive for flank pain. Negative for dysuria and hematuria.  Musculoskeletal:  Negative for arthralgias and back pain.  Skin:  Negative for color change and rash.  Neurological:  Positive for dizziness. Negative for seizures, syncope, facial asymmetry, speech difficulty, weakness, numbness and headaches.  All other systems reviewed and are negative.  Physical Exam Updated Vital Signs BP 131/89   Pulse 63   Temp 97.6 F (36.4 C) (Oral)   Resp 20   Ht 1.803 m (5\' 11" )   Wt 121.6 kg   SpO2 96%   BMI 37.38 kg/m   Physical Exam Vitals and nursing note reviewed.  Constitutional:      General: Eric Sanford is not in acute distress.    Appearance: Normal appearance.  Eric Sanford is well-developed.  HENT:     Head: Normocephalic and atraumatic.  Eyes:     Extraocular Movements: Extraocular movements intact.     Conjunctiva/sclera: Conjunctivae normal.     Pupils: Pupils are equal, round, and reactive to light.  Cardiovascular:     Rate and Rhythm: Normal rate and regular rhythm.     Heart sounds: No murmur heard. Pulmonary:     Effort: Pulmonary effort is normal. No respiratory distress.     Breath sounds: Normal breath sounds.  Chest:     Chest wall: No tenderness.  Abdominal:     General: There is no distension.     Palpations: Abdomen is soft.     Tenderness: There is no  abdominal tenderness. There is no guarding.  Musculoskeletal:        General: No swelling. Normal range of motion.     Cervical back: Neck supple.  Skin:    General: Skin is warm and dry.  Neurological:     General: No focal deficit present.     Mental Status: Eric Sanford is alert and oriented to person, place, and time.     Cranial Nerves: No cranial nerve deficit.     Sensory: No sensory deficit.     Motor: No weakness.    ED Results / Procedures / Treatments   Labs (all labs ordered are listed, but only abnormal results are displayed) Labs Reviewed  BASIC METABOLIC PANEL - Abnormal; Notable for the following components:      Result Value   Glucose, Bld 142 (*)    All other components within normal limits  URINALYSIS, ROUTINE W REFLEX MICROSCOPIC - Abnormal; Notable for the following components:   Color, Urine STRAW (*)    All other components within normal limits  TROPONIN I (HIGH SENSITIVITY) - Abnormal; Notable for the following components:   Troponin I (High Sensitivity) 55 (*)    All other components within normal limits  CBC  TROPONIN I (HIGH SENSITIVITY)    EKG EKG Interpretation  Date/Time:  Wednesday May 17 2021 06:42:37 EDT Ventricular Rate:  73 PR Interval:  161 QRS Duration: 110 QT Interval:  402 QTC Calculation: 443 R Axis:   41 Text Interpretation: Sinus rhythm 12 Lead; Mason-Likar Confirmed by Vanetta Mulders [phone]) on 05/17/2021 7:13:18 AM  Radiology DG Chest 2 View  Result Date: 05/17/2021 CLINICAL DATA:  45 year old male with chest pain and dizziness. EXAM: CHEST - 2 VIEW COMPARISON:  Chest radiographs 12/13/2016 and earlier. CT Abdomen and Pelvis earlier today. FINDINGS: Low but improved lung volumes compared to 2018. Normal cardiac size and mediastinal contours. Visualized tracheal air column is within normal limits. Both lungs today appear clear. No pneumothorax or pleural effusion. No osseous abnormality identified. Negative visible bowel gas pattern.  IMPRESSION: Negative.  No cardiopulmonary abnormality. Electronically Signed   By: Odessa Fleming M.D.   On: 05/17/2021 08:43   CT Head Wo Contrast  Result Date: 05/17/2021 CLINICAL DATA:  Headache EXAM: CT HEAD WITHOUT CONTRAST TECHNIQUE: Contiguous axial images were obtained from the base of the skull through the vertex without intravenous contrast. COMPARISON:  None. FINDINGS: Brain: No acute intracranial abnormality. Specifically, no hemorrhage, hydrocephalus, mass lesion, acute infarction, or significant intracranial injury. Vascular: No hyperdense vessel or unexpected calcification. Skull: No acute calvarial abnormality. Sinuses/Orbits: No acute findings Other: None IMPRESSION: Normal study. Electronically Signed   By: Charlett Nose M.D.   On: 05/17/2021 08:16   CT Renal Stone Study  Result Date: 05/17/2021  CLINICAL DATA:  Right flank pain EXAM: CT ABDOMEN AND PELVIS WITHOUT CONTRAST TECHNIQUE: Multidetector CT imaging of the abdomen and pelvis was performed following the standard protocol without IV contrast. COMPARISON:  None. FINDINGS: Lower chest: Lung bases are clear. No effusions. Heart is normal size. Hepatobiliary: Diffuse low-density throughout the liver compatible with fatty infiltration. No focal abnormality. Gallbladder unremarkable. Pancreas: No focal abnormality or ductal dilatation. Spleen: No focal abnormality.  Normal size. Adrenals/Urinary Tract: No adrenal abnormality. No focal renal abnormality. No stones or hydronephrosis. Urinary bladder is unremarkable. Stomach/Bowel: Normal appendix. Stomach, large and small bowel grossly unremarkable. Vascular/Lymphatic: No evidence of aneurysm or adenopathy. Reproductive: No visible focal abnormality. Other: No free fluid or free air. Musculoskeletal: No acute bony abnormality. IMPRESSION: No renal or ureteral stones.  No hydronephrosis. Hepatic steatosis. No acute findings in the abdomen or pelvis. Electronically Signed   By: Charlett NoseKevin  Dover M.D.   On:  05/17/2021 08:17    Procedures Procedures   Medications Ordered in ED Medications - No data to display  ED Course  I have reviewed the triage vital signs and the nursing notes.  Pertinent labs & imaging results that were available during my care of the patient were reviewed by me and considered in my medical decision making (see chart for details).    MDM Rules/Calculators/A&P                            Patient still reporting dizziness that is not room spinning.  Patient's blood pressure though showed significant improvement.  Last blood pressure was 139 over 91.  As stated patient did take his enalapril medication that Eric Sanford is on for blood pressure today.  And Eric Sanford had not been taking it.  No focal neurodeficit.  But no true vertigo.  Based on this and does have ear ringing.  Head CT was negative chest x-ray without any acute findings.  Wanted to do MRI brain just to rule out any subtle abnormality since patient does not have a primary care doctor.  Patient refused that.  States Eric Sanford gets too anxious.  Did offer Ativan still refused.  CT scan of the abdomen due to the right-sided flank pain without any acute intra-abdominal abnormalities.  No evidence of any kidney stone.  In addition patient's urinalysis is normal.  Patient's white blood cell count and basic metabolic panel is normal other than a blood sugar of 142.  Patient could have some prediabetic findings.  Initial troponin was elevated and Eric Sanford did have a complaint of chest pain.  I was 55.  So delta troponin is pending and will determine disposition.  EKG without any acute findings.   Final Clinical Impression(s) / ED Diagnoses Final diagnoses:  Primary hypertension  Precordial pain  Dizziness  Flank pain    Rx / DC Orders ED Discharge Orders     None        Vanetta MuldersZackowski, Law Corsino, MD 05/17/21 1041  Addendum: Patient's repeat troponin was less than 2.  No evidence of any concern for acute cardiac event.  We will give  patient cardiology referral and referral to wellness clinic.  Have him continue his current blood pressure medicine.  Wellness clinic and trend his his blood pressures.  And can follow-up on the nonvertigo type dizziness.  Again patient does not want to have MRI.    Vanetta MuldersZackowski, Kasidi Shanker, MD 05/17/21 1043  Addendum: Just recently notified by cardiology that they were suspicious that there  was something abnormal about [facility] lab troponin readings.  And in this patient is initial troponin was 55 and the second 1 was less than 2.  Cardiology was seeing a similar trend and other folks in question the lab.  We just got notification that the lab has realized that there has been an error and they will be running troponins for the last 24 hours.  Unfortunately this patient is already been discharged.  Will follow-up on that troponin.    Vanetta Mulders, MD 05/17/21 1201    Vanetta Mulders, MD 05/17/21 1201

## 2021-05-17 NOTE — ED Triage Notes (Signed)
Pt states that for the past couple of weeks he has had chest pain, HTN, dizziness, and tinnitus. Pt states that he has been taking his blood pressure medication as prescribed.

## 2021-05-17 NOTE — Discharge Instructions (Addendum)
Work-up for the various complaints today without any significant findings.  Make an appointment to follow-up with cardiology secondary to the chest pain.  But we had no acute findings today suggestive of an acute cardiac process.  Make an appoint follow the wellness clinic for blood pressure follow-up.  Continue to take your blood pressure medicine.  They will trend your blood pressures to see if any adjustments of your medications.  Also follow-up with them regarding the dizziness.  If new or worse symptoms develop with the dizziness you need to get seen again.

## 2021-05-18 LAB — TROPONIN I (HIGH SENSITIVITY)

## 2021-07-05 ENCOUNTER — Other Ambulatory Visit: Payer: Self-pay

## 2021-07-05 ENCOUNTER — Encounter: Payer: Self-pay | Admitting: Family Medicine

## 2021-07-05 ENCOUNTER — Ambulatory Visit (INDEPENDENT_AMBULATORY_CARE_PROVIDER_SITE_OTHER): Payer: Self-pay | Admitting: Family Medicine

## 2021-07-05 ENCOUNTER — Telehealth: Payer: Self-pay | Admitting: Family Medicine

## 2021-07-05 VITALS — BP 128/94 | HR 74 | Temp 98.3°F | Ht 71.0 in | Wt 258.0 lb

## 2021-07-05 DIAGNOSIS — G8929 Other chronic pain: Secondary | ICD-10-CM

## 2021-07-05 DIAGNOSIS — I10 Essential (primary) hypertension: Secondary | ICD-10-CM | POA: Insufficient documentation

## 2021-07-05 DIAGNOSIS — F418 Other specified anxiety disorders: Secondary | ICD-10-CM | POA: Insufficient documentation

## 2021-07-05 DIAGNOSIS — M545 Low back pain, unspecified: Secondary | ICD-10-CM

## 2021-07-05 DIAGNOSIS — E785 Hyperlipidemia, unspecified: Secondary | ICD-10-CM | POA: Insufficient documentation

## 2021-07-05 MED ORDER — DULOXETINE HCL 30 MG PO CPEP
30.0000 mg | ORAL_CAPSULE | Freq: Every day | ORAL | 3 refills | Status: DC
Start: 2021-07-05 — End: 2021-08-04

## 2021-07-05 MED ORDER — METOPROLOL SUCCINATE ER 25 MG PO TB24: 25.0000 mg | ORAL_TABLET | Freq: Every day | ORAL | 3 refills | Status: DC

## 2021-07-05 MED ORDER — HYDROXYZINE PAMOATE 25 MG PO CAPS: 25.0000 mg | ORAL_CAPSULE | Freq: Three times a day (TID) | ORAL | 0 refills | Status: DC | PRN

## 2021-07-05 NOTE — Telephone Encounter (Signed)
Informed pt with information on Meloxicam. Pt verbalized understanding.  KP

## 2021-07-05 NOTE — Assessment & Plan Note (Signed)
Chronic issue, recheck of lipid panel ordered for risk stratification.  He was to obtain these labs fasting, will review them once available.

## 2021-07-05 NOTE — Telephone Encounter (Signed)
Please review.  KP

## 2021-07-05 NOTE — Assessment & Plan Note (Signed)
Chronic issue that has progressively worsened, localized primarily midline with some paraspinal right lumbar prevalence, denies any symptoms radiating, no bowel/bladder dysfunction.  Physical examination reveals negative straight leg raise, positive facet load, right psoas involvement, sensorimotor intact in the bilateral lower extremities.  At this stage I advised scheduled meloxicam, monitoring for change in pain pattern, have recommended imaging but due to financial constraints patient is opted to hold from this.  He will return for follow-up in 4 weeks for clinical reevaluation.  Pending symptoms at return, can revisit imaging versus escalation of pharmacotherapy, and local injections.  If improved, maintenance measures to be reviewed such as home-based versus formal physical therapy.

## 2021-07-05 NOTE — Telephone Encounter (Signed)
Patient would like to know if PCP plans on sending in gout medication. Caller stated medication list was given at today appointment and it might be allopurinol. Caller would like a follow up call when done.    [facility] [address] [place], [place] - [address] Phone:  [phone]  Fax:  [phone]

## 2021-07-05 NOTE — Assessment & Plan Note (Signed)
Chronic issue.  Today he was noted to have an elevated diastolic pressure in the setting of daily enalapril 5 mg.  Given his comorbid anxiety, beta-blocker usage was discussed and he is amenable to this.  Metoprolol succinate 25 mg written for daily dosing.  Risk stratification labs including lipid panel and TSH ordered.  Return for follow-up in 1 month for clinical reevaluation.

## 2021-07-05 NOTE — Progress Notes (Signed)
Primary Care / Sports Medicine Office Visit  Patient Information:  Patient ID: KYLON Sanford, male DOB: 1976/09/03 Age: 45 y.o. MRN: [id number]   Eric Sanford is a pleasant 45 y.o. male presenting with the following:  Chief Complaint  Patient presents with   New Patient (Initial Visit)   Establish Care   Hypertension    X2 years per patient; checks blood pressure with a machine at home; dizziness, and tachycardia associated; taking enalapril 5 mg daily   Anxiety    Taking alprazolam 0.5-1 mg nightly for sleep and anxiety    Review of Systems pertinent details above   Patient Active Problem List   Diagnosis Date Noted   Chronic midline low back pain without sciatica 07/05/2021   Primary hypertension 07/05/2021   Depression with anxiety 07/05/2021   Hyperlipidemia 07/05/2021   Past Medical History:  Diagnosis Date   Gout    Hyperlipidemia    Hypertension    Outpatient Encounter Medications as of 07/05/2021  Medication Sig   allopurinol (ZYLOPRIM) 300 MG tablet Take 300 mg by mouth daily.   ALPRAZolam (XANAX) 0.5 MG tablet Take 0.5-1 mg by mouth at bedtime as needed for anxiety or sleep.   DULoxetine (CYMBALTA) 30 MG capsule Take 1 capsule (30 mg total) by mouth daily.   enalapril (VASOTEC) 5 MG tablet Take 5 mg by mouth daily.   hydrOXYzine (VISTARIL) 25 MG capsule Take 1 capsule (25 mg total) by mouth every 8 (eight) hours as needed for anxiety.   metoprolol succinate (TOPROL-XL) 25 MG 24 hr tablet Take 1 tablet (25 mg total) by mouth daily.   simvastatin (ZOCOR) 20 MG tablet Take 20 mg by mouth daily.   [DISCONTINUED] amoxicillin-clavulanate (AUGMENTIN) 875-125 MG tablet Take 1 tablet by mouth 2 (two) times daily. One po bid x 7 days   [DISCONTINUED] DM-Doxylamine-Acetaminophen 30-12.03-999 MG/30ML LIQD Take 30 mLs by mouth at bedtime as needed (for cough).   [DISCONTINUED] ofloxacin (OCUFLOX) 0.3 % ophthalmic solution Place 2 drops into the left eye 4 (four) times  daily. 2 drops in left eye four times daily (every 6 hours) for 5 days   [DISCONTINUED] predniSONE (DELTASONE) 10 MG tablet Take 2 tablets (20 mg total) by mouth 2 (two) times daily with a meal.   [DISCONTINUED] promethazine-dextromethorphan (PROMETHAZINE-DM) 6.25-15 MG/5ML syrup Take 5 mLs by mouth 4 (four) times daily as needed for cough.   No facility-administered encounter medications on file as of 07/05/2021.   Past Surgical History:  Procedure Laterality Date   TOOTH EXTRACTION  2017    Vitals:   07/05/21 0838  BP: (!) 128/94  Pulse: 74  Temp: 98.3 F (36.8 C)  SpO2: 98%   Vitals:   07/05/21 0838  Weight: 258 lb (117 kg)  Height: 5\' 11"  (1.803 m)   Body mass index is 35.98 kg/m.  No results found.   Independent interpretation of notes and tests performed by another provider:   None  Procedures performed:   None  Pertinent History, Exam, Impression, and Recommendations:   Primary hypertension Chronic issue.  Today he was noted to have an elevated diastolic pressure in the setting of daily enalapril 5 mg.  Given his comorbid anxiety, beta-blocker usage was discussed and he is amenable to this.  Metoprolol succinate 25 mg written for daily dosing.  Risk stratification labs including lipid panel and TSH ordered.  Return for follow-up in 1 month for clinical reevaluation.  Chronic midline low back pain without sciatica  Chronic issue that has progressively worsened, localized primarily midline with some paraspinal right lumbar prevalence, denies any symptoms radiating, no bowel/bladder dysfunction.  Physical examination reveals negative straight leg raise, positive facet load, right psoas involvement, sensorimotor intact in the bilateral lower extremities.  At this stage I advised scheduled meloxicam, monitoring for change in pain pattern, have recommended imaging but due to financial constraints patient is opted to hold from this.  He will return for follow-up in 4 weeks  for clinical reevaluation.  Pending symptoms at return, can revisit imaging versus escalation of pharmacotherapy, and local injections.  If improved, maintenance measures to be reviewed such as home-based versus formal physical therapy.  Depression with anxiety Depression screen [facility] 2/9 07/05/2021  Decreased Interest 2  Down, Depressed, Hopeless 2  PHQ - 2 Score 4  Altered sleeping 2  Tired, decreased energy 2  Change in appetite 3  Feeling bad or failure about yourself  1  Trouble concentrating 2  Moving slowly or fidgety/restless 1  Suicidal thoughts 0  PHQ-9 Score 15  Difficult doing work/chores Not difficult at all   GAD 7 : Generalized Anxiety Score 07/05/2021  Nervous, Anxious, on Edge 3  Control/stop worrying 3  Worry too much - different things 3  Trouble relaxing 3  Restless 3  Easily annoyed or irritable 3  Afraid - awful might happen 3  Total GAD 7 Score 21  Anxiety Difficulty Extremely difficult   Chronic issue with ongoing exacerbation, monotherapy from previous provider with alprazolam 0.5 mg.  He states that he has trialed an SSRI in the past but is unsure about which specific one.  I reviewed treatment strategy in regards to baseline depression/anxiety control, avoidance of controlled substances, possible need for psychiatric evaluation.  Given his comorbid musculoskeletal concerns, duloxetine 30 mg prescribed with as needed hydroxyzine, he will discontinue alprazolam.  Plan for close follow-up in 4 weeks for reevaluation and possible titration of duloxetine.  Hyperlipidemia Chronic issue, recheck of lipid panel ordered for risk stratification.  He was to obtain these labs fasting, will review them once available.   Orders & Medications Meds ordered this encounter  Medications   DULoxetine (CYMBALTA) 30 MG capsule    Sig: Take 1 capsule (30 mg total) by mouth daily.    Dispense:  30 capsule    Refill:  3   metoprolol succinate (TOPROL-XL) 25 MG 24 hr tablet     Sig: Take 1 tablet (25 mg total) by mouth daily.    Dispense:  90 tablet    Refill:  3   hydrOXYzine (VISTARIL) 25 MG capsule    Sig: Take 1 capsule (25 mg total) by mouth every 8 (eight) hours as needed for anxiety.    Dispense:  30 capsule    Refill:  0   Orders Placed This Encounter  Procedures   TSH Rfx on Abnormal to Free T4   Lipid panel     Return in about 4 weeks (around 08/02/2021).     Jerrol Banana, MD   Primary Care Sports Medicine [facility]

## 2021-07-05 NOTE — Patient Instructions (Signed)
-   Start meloxicam once daily with food - Start metoprolol once daily - Start duloxetine once every evening - Can dose hydroxyzine every 8 hours as-needed for anxiety - Continue your other blood pressure and cholesterol medications - Obtain fasting labs - Contact for questions - Return in 4 weeks

## 2021-07-05 NOTE — Assessment & Plan Note (Signed)
Depression screen PHQ 2/9 07/05/2021  Decreased Interest 2  Down, Depressed, Hopeless 2  PHQ - 2 Score 4  Altered sleeping 2  Tired, decreased energy 2  Change in appetite 3  Feeling bad or failure about yourself  1  Trouble concentrating 2  Moving slowly or fidgety/restless 1  Suicidal thoughts 0  PHQ-9 Score 15  Difficult doing work/chores Not difficult at all   GAD 7 : Generalized Anxiety Score 07/05/2021  Nervous, Anxious, on Edge 3  Control/stop worrying 3  Worry too much - different things 3  Trouble relaxing 3  Restless 3  Easily annoyed or irritable 3  Afraid - awful might happen 3  Total GAD 7 Score 21  Anxiety Difficulty Extremely difficult   Chronic issue with ongoing exacerbation, monotherapy from previous provider with alprazolam 0.5 mg.  He states that he has trialed an SSRI in the past but is unsure about which specific one.  I reviewed treatment strategy in regards to baseline depression/anxiety control, avoidance of controlled substances, possible need for psychiatric evaluation.  Given his comorbid musculoskeletal concerns, duloxetine 30 mg prescribed with as needed hydroxyzine, he will discontinue alprazolam.  Plan for close follow-up in 4 weeks for reevaluation and possible titration of duloxetine.

## 2021-07-25 ENCOUNTER — Ambulatory Visit: Payer: Self-pay | Admitting: Nurse Practitioner

## 2021-08-03 ENCOUNTER — Other Ambulatory Visit: Payer: Self-pay

## 2021-08-03 DIAGNOSIS — I10 Essential (primary) hypertension: Secondary | ICD-10-CM

## 2021-08-03 DIAGNOSIS — E7800 Pure hypercholesterolemia, unspecified: Secondary | ICD-10-CM

## 2021-08-03 LAB — LIPID PANEL
Chol/HDL Ratio: 5 ratio (ref 0.0–5.0)
Cholesterol, Total: 208 mg/dL — ABNORMAL HIGH (ref 100–199)
HDL: 42 mg/dL (ref >39)
LDL Chol Calc (NIH): 104 mg/dL — ABNORMAL HIGH (ref 0–99)
Triglycerides: 364 mg/dL — ABNORMAL HIGH (ref 0–149)
VLDL Cholesterol Cal: 62 mg/dL — ABNORMAL HIGH (ref 5–40)

## 2021-08-03 LAB — TSH RFX ON ABNORMAL TO FREE T4: TSH: 3.26 u[IU]/mL (ref 0.450–4.500)

## 2021-08-03 MED ORDER — ENALAPRIL MALEATE 5 MG PO TABS: 5.0000 mg | ORAL_TABLET | Freq: Every day | ORAL | 0 refills | Status: DC

## 2021-08-03 MED ORDER — SIMVASTATIN 20 MG PO TABS: 20.0000 mg | ORAL_TABLET | Freq: Every day | ORAL | 0 refills | Status: DC

## 2021-08-04 ENCOUNTER — Other Ambulatory Visit: Payer: Self-pay

## 2021-08-04 ENCOUNTER — Ambulatory Visit (INDEPENDENT_AMBULATORY_CARE_PROVIDER_SITE_OTHER): Payer: Self-pay | Admitting: Family Medicine

## 2021-08-04 VITALS — BP 122/92 | HR 80 | Ht 71.0 in | Wt 255.0 lb

## 2021-08-04 DIAGNOSIS — F418 Other specified anxiety disorders: Secondary | ICD-10-CM

## 2021-08-04 DIAGNOSIS — M545 Low back pain, unspecified: Secondary | ICD-10-CM

## 2021-08-04 DIAGNOSIS — E785 Hyperlipidemia, unspecified: Secondary | ICD-10-CM

## 2021-08-04 DIAGNOSIS — F5104 Psychophysiologic insomnia: Secondary | ICD-10-CM | POA: Insufficient documentation

## 2021-08-04 DIAGNOSIS — G8929 Other chronic pain: Secondary | ICD-10-CM

## 2021-08-04 DIAGNOSIS — I10 Essential (primary) hypertension: Secondary | ICD-10-CM

## 2021-08-04 MED ORDER — DULOXETINE HCL 60 MG PO CPEP: 60.0000 mg | ORAL_CAPSULE | Freq: Every evening | ORAL | 1 refills | Status: DC

## 2021-08-04 MED ORDER — TRAZODONE HCL 50 MG PO TABS: 50.0000 mg | ORAL_TABLET | Freq: Every day | ORAL | 1 refills | Status: DC

## 2021-08-04 NOTE — Assessment & Plan Note (Signed)
Chronic condition that is stable, most likely obtaining benefit from duloxetine, we will continue monitoring this issue.

## 2021-08-04 NOTE — Assessment & Plan Note (Signed)
Recent labs are reassuring, further lifestyle modifications recommended, we will track this in the future.

## 2021-08-04 NOTE — Progress Notes (Signed)
Primary Care / Sports Medicine Office Visit  Patient Information:  Patient ID: Eric Sanford, male DOB: 10-05-1976 Age: 45 y.o. MRN: [id number]   Eric Sanford is a pleasant 45 y.o. male presenting with the following:  Chief Complaint  Patient presents with   Follow-up   Hypertension     Has been out of simvastatin for about a month now.  He also stated his systolic blood pressure is around 150-160.  Has been taking metoprolol, but ran out of enalapril about a month ago also.  Refilled simvastatin and enalapril for patient yesterday.   Back Pain    No pain in office today    Review of Systems pertinent details above   Patient Active Problem List   Diagnosis Date Noted   Psychophysiological insomnia 08/04/2021   Chronic midline low back pain without sciatica 07/05/2021   Primary hypertension 07/05/2021   Depression with anxiety 07/05/2021   Hyperlipidemia 07/05/2021   Past Medical History:  Diagnosis Date   Gout    Hyperlipidemia    Hypertension    Outpatient Encounter Medications as of 08/04/2021  Medication Sig   allopurinol (ZYLOPRIM) 300 MG tablet Take 300 mg by mouth daily.   ALPRAZolam (XANAX) 0.5 MG tablet Take 0.5-1 mg by mouth at bedtime as needed for anxiety or sleep.   enalapril (VASOTEC) 5 MG tablet Take 1 tablet (5 mg total) by mouth daily.   hydrOXYzine (VISTARIL) 25 MG capsule Take 1 capsule (25 mg total) by mouth every 8 (eight) hours as needed for anxiety.   metoprolol succinate (TOPROL-XL) 25 MG 24 hr tablet Take 1 tablet (25 mg total) by mouth daily.   simvastatin (ZOCOR) 20 MG tablet Take 1 tablet (20 mg total) by mouth daily.   traZODone (DESYREL) 50 MG tablet Take 1 tablet (50 mg total) by mouth at bedtime.   [DISCONTINUED] DULoxetine (CYMBALTA) 30 MG capsule Take 1 capsule (30 mg total) by mouth daily.   DULoxetine (CYMBALTA) 60 MG capsule Take 1 capsule (60 mg total) by mouth at bedtime.   No facility-administered encounter medications on  file as of 08/04/2021.   Past Surgical History:  Procedure Laterality Date   TOOTH EXTRACTION  2017    Vitals:   08/04/21 0937  BP: (!) 122/92  Pulse: 80  SpO2: 96%   Vitals:   08/04/21 0937  Weight: 255 lb (115.7 kg)  Height: 5\' 11"  (1.803 m)   Body mass index is 35.57 kg/m.  No results found.   Independent interpretation of notes and tests performed by another provider:   None  Procedures performed:   None  Pertinent History, Exam, Impression, and Recommendations:   Psychophysiological insomnia Chronic issue with ongoing exacerbation.  His PHQ and GAD scores have improved, plan for further titration of duloxetine.  Additionally adjunct trazodone started for nightly use, we will reassess response at follow-up.  Depression with anxiety Depression screen [facility] 2/9 08/04/2021 07/05/2021  Decreased Interest 1 2  Down, Depressed, Hopeless 2 2  PHQ - 2 Score 3 4  Altered sleeping 2 2  Tired, decreased energy 1 2  Change in appetite 1 3  Feeling bad or failure about yourself  1 1  Trouble concentrating 1 2  Moving slowly or fidgety/restless 2 1  Suicidal thoughts 0 0  PHQ-9 Score 11 15  Difficult doing work/chores Somewhat difficult Not difficult at all   GAD 7 : Generalized Anxiety Score 08/04/2021 07/05/2021  Nervous, Anxious, on Edge 2 3  Control/stop worrying 2 3  Worry too much - different things 2 3  Trouble relaxing 2 3  Restless 1 3  Easily annoyed or irritable 1 3  Afraid - awful might happen 2 3  Total GAD 7 Score 12 21  Anxiety Difficulty Very difficult Extremely difficult   Excellent interval decrease in scores for PHQ and GAD.  Given his persistent symptomatology we will further titrate duloxetine from 30 mg to 60 mg.  We will follow-up his symptoms at his return in 2 months.  Chronic midline low back pain without sciatica Chronic condition that is stable, most likely obtaining benefit from duloxetine, we will continue monitoring this  issue.  Hyperlipidemia Recent labs are reassuring, further lifestyle modifications recommended, we will track this in the future.  Primary hypertension Chronic issue that appears stable, there is been issues with patient being out of medication, will restart enalapril and metoprolol, plan for follow-up in 2 months.   Orders & Medications Meds ordered this encounter  Medications   traZODone (DESYREL) 50 MG tablet    Sig: Take 1 tablet (50 mg total) by mouth at bedtime.    Dispense:  30 tablet    Refill:  1   DULoxetine (CYMBALTA) 60 MG capsule    Sig: Take 1 capsule (60 mg total) by mouth at bedtime.    Dispense:  30 capsule    Refill:  1   No orders of the defined types were placed in this encounter.    Return in about 8 weeks (around 09/29/2021).     Eric Banana, MD   Primary Care Sports Medicine [facility]

## 2021-08-04 NOTE — Assessment & Plan Note (Signed)
Chronic issue with ongoing exacerbation.  His PHQ and GAD scores have improved, plan for further titration of duloxetine.  Additionally adjunct trazodone started for nightly use, we will reassess response at follow-up.

## 2021-08-04 NOTE — Assessment & Plan Note (Signed)
Chronic issue that appears stable, there is been issues with patient being out of medication, will restart enalapril and metoprolol, plan for follow-up in 2 months.

## 2021-08-04 NOTE — Assessment & Plan Note (Signed)
Depression screen [facility] 2/9 08/04/2021 07/05/2021  Decreased Interest 1 2  Down, Depressed, Hopeless 2 2  PHQ - 2 Score 3 4  Altered sleeping 2 2  Tired, decreased energy 1 2  Change in appetite 1 3  Feeling bad or failure about yourself  1 1  Trouble concentrating 1 2  Moving slowly or fidgety/restless 2 1  Suicidal thoughts 0 0  PHQ-9 Score 11 15  Difficult doing work/chores Somewhat difficult Not difficult at all   GAD 7 : Generalized Anxiety Score 08/04/2021 07/05/2021  Nervous, Anxious, on Edge 2 3  Control/stop worrying 2 3  Worry too much - different things 2 3  Trouble relaxing 2 3  Restless 1 3  Easily annoyed or irritable 1 3  Afraid - awful might happen 2 3  Total GAD 7 Score 12 21  Anxiety Difficulty Very difficult Extremely difficult   Excellent interval decrease in scores for PHQ and GAD.  Given his persistent symptomatology we will further titrate duloxetine from 30 mg to 60 mg.  We will follow-up his symptoms at his return in 2 months.

## 2021-08-04 NOTE — Patient Instructions (Addendum)
-   Increase duloxetine to 2 capsules nightly (60 mg total) until you are out of your 30 mg capsules - I will send in a new prescription for duloxetine 60 mg for you to dose nightly - Dose daily enalapril 5 mg - Dose daily metoprolol succinate 25 mg - Dose daily simvastatin 20 mg - Start new medication trazodone 50 mg nightly - Recommend obtaining Tdap through health department - Return for follow-up in 2 months - Contact us for questions between now and then

## 2021-10-04 ENCOUNTER — Ambulatory Visit: Payer: Self-pay | Admitting: Family Medicine

## 2021-10-16 ENCOUNTER — Ambulatory Visit: Payer: Self-pay | Admitting: Family Medicine

## 2021-10-19 ENCOUNTER — Ambulatory Visit (INDEPENDENT_AMBULATORY_CARE_PROVIDER_SITE_OTHER): Payer: Self-pay | Admitting: Family Medicine

## 2021-10-19 ENCOUNTER — Other Ambulatory Visit: Payer: Self-pay

## 2021-10-19 ENCOUNTER — Encounter: Payer: Self-pay | Admitting: Family Medicine

## 2021-10-19 VITALS — BP 120/88 | HR 70 | Ht 71.0 in | Wt 266.0 lb

## 2021-10-19 DIAGNOSIS — G8929 Other chronic pain: Secondary | ICD-10-CM

## 2021-10-19 DIAGNOSIS — F418 Other specified anxiety disorders: Secondary | ICD-10-CM

## 2021-10-19 DIAGNOSIS — J309 Allergic rhinitis, unspecified: Secondary | ICD-10-CM | POA: Insufficient documentation

## 2021-10-19 DIAGNOSIS — F5104 Psychophysiologic insomnia: Secondary | ICD-10-CM

## 2021-10-19 DIAGNOSIS — M545 Low back pain, unspecified: Secondary | ICD-10-CM

## 2021-10-19 DIAGNOSIS — I10 Essential (primary) hypertension: Secondary | ICD-10-CM

## 2021-10-19 MED ORDER — DULOXETINE HCL 30 MG PO CPEP: ORAL_CAPSULE | ORAL | 0 refills | Status: DC

## 2021-10-19 NOTE — Assessment & Plan Note (Signed)
Patient with few week history of congestion, ear ringing, sinus pressure, cough.  Denies any fevers or chills, uncertain about sick contacts.  Denies any shortness of air or chest pain.  No treatments to date.  Physical examination reveals benign tympanic membranes bilaterally, swollen and erythematous turbinates bilaterally, nontender sinuses, oropharynx benign, cardiopulmonary findings benign.  Constellation of findings most consistent with allergic rhinitis, postnasal drip.  Plan for 7-day course of Flonase and OTC antihistamine, he can use these medications on as-needed basis thereafter if beneficial.  I advised him to contact us if symptoms fail to improve at which time infectious etiology is to be considered.

## 2021-10-19 NOTE — Assessment & Plan Note (Signed)
Chronic condition that is stable, blood pressure well controlled on current regimen

## 2021-10-19 NOTE — Patient Instructions (Addendum)
-   Restart Cymbalta (duloxetine) - Dose 1 capsule for 2 weeks (30 mg daily) - Then-2 capsules until follow-up (60 mg daily) - Use melatonin 3-5 mg nightly (avoid light exposure after dosing) - Use Flonase, 2 sprays in each nostril daily x7 days, then as needed - Use antihistamine (Claritin, Zyrtec, Allegra, etc.) daily x7 days, then as needed next - Return for follow-up in 1 month  Ask about free mental health services [facility] Call us: [phone] Visit [place]: [address], [address] [place], [place] [address]

## 2021-10-19 NOTE — Assessment & Plan Note (Signed)
Has been stable, secondary to thoughts racing and intrusive thoughts.  Cites mild improvement following duloxetine initiation.  At the last visit, he self discontinued duloxetine at start of trazodone at his place prior to his bedtime and had expected dizziness.  At this visit he has been off of medications for over a month.  I discussed the nature of his insomnia and treatment strategies.  Plan for restart of duloxetine with titration, follow-up with psychiatry, sleep hygiene practices, and melatonin.  He will return in 1 month at which time we can discuss additional focal therapies.

## 2021-10-19 NOTE — Progress Notes (Signed)
Primary Care / Sports Medicine Office Visit  Patient Information:  Patient ID: Eric Sanford, male DOB: 26-Nov-1975 Age: 45 y.o. MRN: [id number]   Eric Sanford is a pleasant 45 y.o. male presenting with the following:  Chief Complaint  Patient presents with   Follow-up   Insomnia    Trazodone ineffective and causes dizziness per patient; averaging about 4 hours of sleep nightly   Chronic midline low back pain without sciatica    Better, denies pain in office   Depression    No longer taking duloxetine due to running out of refills per patient, also caused dizziness   Hyperlipidemia    Tolerating simvastatin well    Patient Active Problem List   Diagnosis Date Noted   Allergic rhinitis 10/19/2021   Psychophysiological insomnia 08/04/2021   Chronic midline low back pain without sciatica 07/05/2021   Primary hypertension 07/05/2021   Depression with anxiety 07/05/2021   Hyperlipidemia 07/05/2021    Vitals:   10/19/21 0802  BP: 120/88  Pulse: 70  SpO2: 99%   Vitals:   10/19/21 0802  Weight: 266 lb (120.7 kg)  Height: 5\' 11"  (1.803 m)   Body mass index is 37.1 kg/m.  No results found.   Independent interpretation of notes and tests performed by another provider:   None  Procedures performed:   None  Pertinent History, Exam, Impression, and Recommendations:   Primary hypertension Chronic condition that is stable, blood pressure well controlled on current regimen  Chronic midline low back pain without sciatica Chronic condition that appears stable, plan for restart of duloxetine as he had unfortunately missed appointments and has been out of the medication, will restart at 30 mg and then titrate to 60 mg until follow-up in 1 month.  Depression with anxiety Depression screen [facility] 2/9 10/19/2021 08/04/2021 07/05/2021  Decreased Interest 1 1 2   Down, Depressed, Hopeless 1 2 2   PHQ - 2 Score 2 3 4   Altered sleeping 2 2 2   Tired, decreased energy 0 1 2   Change in appetite 2 1 3   Feeling bad or failure about yourself  0 1 1  Trouble concentrating 1 1 2   Moving slowly or fidgety/restless 1 2 1   Suicidal thoughts 0 0 0  PHQ-9 Score 8 11 15   Difficult doing work/chores Not difficult at all Somewhat difficult Not difficult at all   GAD 7 : Generalized Anxiety Score 10/19/2021 08/04/2021 07/05/2021  Nervous, Anxious, on Edge 1 2 3   Control/stop worrying 2 2 3   Worry too much - different things 2 2 3   Trouble relaxing 2 2 3   Restless 2 1 3   Easily annoyed or irritable 1 1 3   Afraid - awful might happen 2 2 3   Total GAD 7 Score 12 12 21   Anxiety Difficulty - Very difficult Extremely difficult   PHQ numbers have trended down, GAD numbers have stabilized after initial decrease.  Unfortunately, due to recent missed appointments, patient has been out of duloxetine.  At this stage plan to restart this medication at 30 mg x 2 weeks then titrate to 60 mg until his return in 1 month.  Additionally, he is amenable to further evaluation by psychiatry for medication management and cognitive behavioral therapy.  Due to financial constraints, we did provide information on possible free services in the area.  Psychophysiological insomnia Has been stable, secondary to thoughts racing and intrusive thoughts.  Cites mild improvement following duloxetine initiation.  At the last visit, he  self discontinued duloxetine at start of trazodone at his place prior to his bedtime and had expected dizziness.  At this visit he has been off of medications for over a month.  I discussed the nature of his insomnia and treatment strategies.  Plan for restart of duloxetine with titration, follow-up with psychiatry, sleep hygiene practices, and melatonin.  He will return in 1 month at which time we can discuss additional focal therapies.  Allergic rhinitis Patient with few week history of congestion, ear ringing, sinus pressure, cough.  Denies any fevers or chills, uncertain about  sick contacts.  Denies any shortness of air or chest pain.  No treatments to date.  Physical examination reveals benign tympanic membranes bilaterally, swollen and erythematous turbinates bilaterally, nontender sinuses, oropharynx benign, cardiopulmonary findings benign.  Constellation of findings most consistent with allergic rhinitis, postnasal drip.  Plan for 7-day course of Flonase and OTC antihistamine, he can use these medications on as-needed basis thereafter if beneficial.  I advised him to contact us if symptoms fail to improve at which time infectious etiology is to be considered.   Orders & Medications Meds ordered this encounter  Medications   DULoxetine (CYMBALTA) 30 MG capsule    Sig: Take 1 capsule (30 mg total) by mouth every evening for 14 days, THEN 2 capsules (60 mg total) every evening for 16 days.    Dispense:  46 capsule    Refill:  0   No orders of the defined types were placed in this encounter.    Return in about 1 month (around 11/19/2021).     Jerrol Banana, MD   Primary Care Sports Medicine [facility]

## 2021-10-19 NOTE — Assessment & Plan Note (Signed)
Depression screen [facility] 2/9 10/19/2021 08/04/2021 07/05/2021  Decreased Interest 1 1 2   Down, Depressed, Hopeless 1 2 2   PHQ - 2 Score 2 3 4   Altered sleeping 2 2 2   Tired, decreased energy 0 1 2  Change in appetite 2 1 3   Feeling bad or failure about yourself  0 1 1  Trouble concentrating 1 1 2   Moving slowly or fidgety/restless 1 2 1   Suicidal thoughts 0 0 0  PHQ-9 Score 8 11 15   Difficult doing work/chores Not difficult at all Somewhat difficult Not difficult at all   GAD 7 : Generalized Anxiety Score 10/19/2021 08/04/2021 07/05/2021  Nervous, Anxious, on Edge 1 2 3   Control/stop worrying 2 2 3   Worry too much - different things 2 2 3   Trouble relaxing 2 2 3   Restless 2 1 3   Easily annoyed or irritable 1 1 3   Afraid - awful might happen 2 2 3   Total GAD 7 Score 12 12 21   Anxiety Difficulty - Very difficult Extremely difficult   PHQ numbers have trended down, GAD numbers have stabilized after initial decrease.  Unfortunately, due to recent missed appointments, patient has been out of duloxetine.  At this stage plan to restart this medication at 30 mg x 2 weeks then titrate to 60 mg until his return in 1 month.  Additionally, he is amenable to further evaluation by psychiatry for medication management and cognitive behavioral therapy.  Due to financial constraints, we did provide information on possible free services in the area.

## 2021-10-19 NOTE — Assessment & Plan Note (Signed)
Chronic condition that appears stable, plan for restart of duloxetine as he had unfortunately missed appointments and has been out of the medication, will restart at 30 mg and then titrate to 60 mg until follow-up in 1 month.

## 2021-10-27 ENCOUNTER — Other Ambulatory Visit: Payer: Self-pay | Admitting: Family Medicine

## 2021-10-27 NOTE — Telephone Encounter (Unsigned)
Copied from CRM [id number]. Topic: Quick Communication - Rx Refill/Question >> Oct 27, 2021  3:55 PM Marylen Ponto wrote: Medication: allopurinol (ZYLOPRIM) 300 MG tablet  Has the patient contacted their pharmacy? No. Pt told to contact provider (Agent: If no, request that the patient contact the pharmacy for the refill. If patient does not wish to contact the pharmacy document the reason why and proceed with request.) (Agent: If yes, when and what did the pharmacy advise?)  Preferred Pharmacy (with phone number or street name): [facility] [address] [place], [place] - [address]  Phone: [phone] Fax: [phone]  Has the patient been seen for an appointment in the last year OR does the patient have an upcoming appointment? Yes.    Agent: Please be advised that RX refills may take up to 3 business days. We ask that you follow-up with your pharmacy.

## 2021-10-27 NOTE — Telephone Encounter (Signed)
Requested medication (s) are due for refill today: yes  Requested medication (s) are on the active medication list: yes  Last refill:  unsure  Future visit scheduled: yes  Notes to clinic:  historical med, no uric acid lab on file. Please advise.      Requested Prescriptions  Pending Prescriptions Disp Refills   allopurinol (ZYLOPRIM) 300 MG tablet      Sig: Take 1 tablet (300 mg total) by mouth daily.     Endocrinology:  Gout Agents Failed - 10/27/2021  4:05 PM      Failed - Uric Acid in normal range and within 360 days    No results found for: POCURA, LABURIC        Passed - Cr in normal range and within 360 days    Creatinine, Ser  Date Value Ref Range Status  05/17/2021 0.74 0.61 - 1.24 mg/dL Final          Passed - Valid encounter within last 12 months    Recent Outpatient Visits           1 week ago Depression with anxiety   [facility] Jerrol Banana, MD   2 months ago Psychophysiological insomnia   [facility] Jerrol Banana, MD   3 months ago Chronic midline low back pain without sciatica   [facility] Jerrol Banana, MD       Future Appointments             In 3 weeks Ashley Royalty, Ocie Bob, MD [facility]

## 2021-10-31 ENCOUNTER — Other Ambulatory Visit: Payer: Self-pay | Admitting: Family Medicine

## 2021-10-31 DIAGNOSIS — M109 Gout, unspecified: Secondary | ICD-10-CM

## 2021-10-31 MED ORDER — ALLOPURINOL 300 MG PO TABS: 300.0000 mg | ORAL_TABLET | Freq: Every day | ORAL | 0 refills | Status: DC

## 2021-10-31 MED ORDER — INDOMETHACIN 50 MG PO CAPS: 50.0000 mg | ORAL_CAPSULE | Freq: Two times a day (BID) | ORAL | 0 refills | Status: DC

## 2021-10-31 NOTE — Telephone Encounter (Signed)
Refill if appropriate.  Please advise. Patient has an appointment on 11/20/21.

## 2021-10-31 NOTE — Telephone Encounter (Signed)
Please let patient / wife know that if he is experiencing a gout flare up, the allopurinol can worsen it (the allopurinol is designed to be a maintenance to keep a flare up from happening).  I am writing for indomethacin he is to take alongside the allopurinol to minimize chances of worsening the flare.  He needs to continue all of his other medications and return as scheduled on 11/20/21. Please contact for any worsening.

## 2021-10-31 NOTE — Telephone Encounter (Signed)
Pts wife is calling to check on the status of medication for goat flare up. Pts wife states that at last appt there was a discussion if something was needed for goat to call the office. Pts wife declined appt for the pt for this reason.  Preferred Pharmacy- [address] [place], [place].  [phone]

## 2021-11-20 ENCOUNTER — Ambulatory Visit: Payer: Self-pay | Admitting: Family Medicine

## 2021-11-27 ENCOUNTER — Encounter: Payer: Self-pay | Admitting: Family Medicine

## 2021-11-27 ENCOUNTER — Other Ambulatory Visit: Payer: Self-pay

## 2021-11-27 ENCOUNTER — Ambulatory Visit (INDEPENDENT_AMBULATORY_CARE_PROVIDER_SITE_OTHER): Payer: Self-pay | Admitting: Family Medicine

## 2021-11-27 VITALS — BP 124/84 | HR 87 | Ht 71.0 in | Wt 270.0 lb

## 2021-11-27 DIAGNOSIS — F419 Anxiety disorder, unspecified: Secondary | ICD-10-CM | POA: Insufficient documentation

## 2021-11-27 DIAGNOSIS — F418 Other specified anxiety disorders: Secondary | ICD-10-CM

## 2021-11-27 DIAGNOSIS — E7800 Pure hypercholesterolemia, unspecified: Secondary | ICD-10-CM

## 2021-11-27 DIAGNOSIS — M545 Low back pain, unspecified: Secondary | ICD-10-CM

## 2021-11-27 DIAGNOSIS — F5104 Psychophysiologic insomnia: Secondary | ICD-10-CM

## 2021-11-27 DIAGNOSIS — I10 Essential (primary) hypertension: Secondary | ICD-10-CM

## 2021-11-27 DIAGNOSIS — M1A09X0 Idiopathic chronic gout, multiple sites, without tophus (tophi): Secondary | ICD-10-CM

## 2021-11-27 DIAGNOSIS — F32A Depression, unspecified: Secondary | ICD-10-CM | POA: Insufficient documentation

## 2021-11-27 DIAGNOSIS — G8929 Other chronic pain: Secondary | ICD-10-CM

## 2021-11-27 DIAGNOSIS — M109 Gout, unspecified: Secondary | ICD-10-CM

## 2021-11-27 DIAGNOSIS — H9312 Tinnitus, left ear: Secondary | ICD-10-CM

## 2021-11-27 MED ORDER — DULOXETINE HCL 60 MG PO CPEP: 60.0000 mg | ORAL_CAPSULE | Freq: Every evening | ORAL | 0 refills | Status: DC

## 2021-11-27 MED ORDER — SIMVASTATIN 20 MG PO TABS: 20.0000 mg | ORAL_TABLET | Freq: Every day | ORAL | 0 refills | Status: DC

## 2021-11-27 MED ORDER — ENALAPRIL MALEATE 5 MG PO TABS: 5.0000 mg | ORAL_TABLET | Freq: Every day | ORAL | 0 refills | Status: DC

## 2021-11-27 MED ORDER — METOPROLOL SUCCINATE ER 25 MG PO TB24: 25.0000 mg | ORAL_TABLET | Freq: Every day | ORAL | 0 refills | Status: DC

## 2021-11-27 MED ORDER — ALLOPURINOL 300 MG PO TABS: 300.0000 mg | ORAL_TABLET | Freq: Every day | ORAL | 0 refills | Status: DC

## 2021-11-27 NOTE — Assessment & Plan Note (Signed)
Chronic issue with ongoing symptomatology/uncontrolled with worsened PHQ and GAD scores noted today.  Subjectively he cites minor improvement on duloxetine 30 mg.  He never had an opportunity to titrate to 60 mg and we discussed the same today.  He is amenable to dose increase and a new Rx was provided for him to start.  He is to provide a status update via MyChart or phone call in 1 month to assess his response.

## 2021-11-27 NOTE — Assessment & Plan Note (Signed)
Patient presented last visit with several weeks of congestion, ear ringing, sinus pressure.  Concern for allergic rhinitis etiology and he was advised initiate intranasal steroid.  He completed the course and symptoms have resolved save for the left ear tinnitus.  ENT examination today with benign tympanic membranes, nasopharynx, oropharynx, nontender sinuses.  Tinnitus may represent sequelae of paraspinal cervical musculature relative spasm, which will be addressing with titration of duloxetine.  Additional ideology evaluation to be reviewed by ENT, a referral was placed today.

## 2021-11-27 NOTE — Progress Notes (Signed)
°  ° °  Primary Care / Sports Medicine Office Visit  Patient Information:  Patient ID: Eric Sanford, male DOB: Aug 15, 1976 Age: 46 y.o. MRN: [id number]   [name] is a pleasant 46 y.o. male presenting with the following:  Chief Complaint  Patient presents with   Depression   Back Pain    Vitals:   11/27/21 0843  BP: 124/84  Pulse: 87  SpO2: 96%   Vitals:   11/27/21 0843  Weight: 270 lb (122.5 kg)  Height: 5\' 11"  (1.803 m)   Body mass index is 37.66 kg/m.  No results found.   Independent interpretation of notes and tests performed by another provider:   None  Procedures performed:   None  Pertinent History, Exam, Impression, and Recommendations:   Tinnitus of left ear Patient presented last visit with several weeks of congestion, ear ringing, sinus pressure.  Concern for allergic rhinitis etiology and he was advised initiate intranasal steroid.  He completed the course and symptoms have resolved save for the left ear tinnitus.  ENT examination today with benign tympanic membranes, nasopharynx, oropharynx, nontender sinuses.  Tinnitus may represent sequelae of paraspinal cervical musculature relative spasm, which will be addressing with titration of duloxetine.  Additional ideology evaluation to be reviewed by ENT, a referral was placed today.  Depression with anxiety Chronic issue with ongoing symptomatology/uncontrolled with worsened PHQ and GAD scores noted today.  Subjectively he cites minor improvement on duloxetine 30 mg.  He never had an opportunity to titrate to 60 mg and we discussed the same today.  He is amenable to dose increase and a new Rx was provided for him to start.  He is to provide a status update via MyChart or phone call in 1 month to assess his response.  Gout Chronic condition that is currently stable/controlled.  Patient had been dosing allopurinol on and off in the past, given his recurrent flare episodes we had discussed maintenance with  allopurinol.  He is amenable to this strategy and 90-day supply of allopurinol 300 mg prescribed today for him to continuously dose.   Orders & Medications Meds ordered this encounter  Medications   DULoxetine (CYMBALTA) 60 MG capsule    Sig: Take 1 capsule (60 mg total) by mouth every evening.    Dispense:  90 capsule    Refill:  0   allopurinol (ZYLOPRIM) 300 MG tablet    Sig: Take 1 tablet (300 mg total) by mouth daily.    Dispense:  90 tablet    Refill:  0   enalapril (VASOTEC) 5 MG tablet    Sig: Take 1 tablet (5 mg total) by mouth daily.    Dispense:  90 tablet    Refill:  0   metoprolol succinate (TOPROL-XL) 25 MG 24 hr tablet    Sig: Take 1 tablet (25 mg total) by mouth daily.    Dispense:  90 tablet    Refill:  0   simvastatin (ZOCOR) 20 MG tablet    Sig: Take 1 tablet (20 mg total) by mouth daily.    Dispense:  90 tablet    Refill:  0   Orders Placed This Encounter  Procedures   Ambulatory referral to ENT     Return in about 3 months (around 02/25/2022).     [name], MD   Primary Care Sports Medicine [place]

## 2021-11-27 NOTE — Patient Instructions (Addendum)
-   Start new dose of duloxetine (Cymbalta) 60 mg every evening - Continue your other medications daily scheduled - Referral coordinator will contact you for follow-up with ear nose and throat doctor (ENT) - Schedule a follow-up with psychiatry - Use MyChart or phone call to contact us in 1 month for a status update - Return for in person visit in 3 months  Below are places you can call or see for depression/ anxiety/ mood disorders:  [facility] [place]:  [phone] [facility] [place]: [phone] WALK IN CLINIC: Monday, Wednesday, Friday 8am- 330 PM 3. [facility]: [phone] WALK IN CLINIC: Monday-Friday 9AM-4PM 4. [facility]: [phone] 5. [facility]: [phone] 6. [facility]: [phone] 7. [facility] [place]: [phone] 8. Dr. Caryn Section: [phone]

## 2021-11-27 NOTE — Assessment & Plan Note (Signed)
Chronic condition that is currently stable/controlled.  Patient had been dosing allopurinol on and off in the past, given his recurrent flare episodes we had discussed maintenance with allopurinol.  He is amenable to this strategy and 90-day supply of allopurinol 300 mg prescribed today for him to continuously dose.

## 2022-02-27 ENCOUNTER — Ambulatory Visit (INDEPENDENT_AMBULATORY_CARE_PROVIDER_SITE_OTHER): Payer: Self-pay | Admitting: Family Medicine

## 2022-02-27 ENCOUNTER — Encounter: Payer: Self-pay | Admitting: Family Medicine

## 2022-02-27 VITALS — BP 138/88 | HR 74 | Ht 71.0 in | Wt 273.0 lb

## 2022-02-27 DIAGNOSIS — F418 Other specified anxiety disorders: Secondary | ICD-10-CM

## 2022-02-27 DIAGNOSIS — F5104 Psychophysiologic insomnia: Secondary | ICD-10-CM

## 2022-02-27 DIAGNOSIS — J309 Allergic rhinitis, unspecified: Secondary | ICD-10-CM

## 2022-02-27 DIAGNOSIS — M545 Low back pain, unspecified: Secondary | ICD-10-CM

## 2022-02-27 DIAGNOSIS — G8929 Other chronic pain: Secondary | ICD-10-CM

## 2022-02-27 DIAGNOSIS — E7800 Pure hypercholesterolemia, unspecified: Secondary | ICD-10-CM

## 2022-02-27 DIAGNOSIS — I10 Essential (primary) hypertension: Secondary | ICD-10-CM

## 2022-02-27 DIAGNOSIS — M109 Gout, unspecified: Secondary | ICD-10-CM

## 2022-02-27 MED ORDER — ALLOPURINOL 300 MG PO TABS: 300.0000 mg | ORAL_TABLET | Freq: Every day | ORAL | 0 refills | Status: DC

## 2022-02-27 MED ORDER — SIMVASTATIN 20 MG PO TABS: 20.0000 mg | ORAL_TABLET | Freq: Every day | ORAL | 0 refills | Status: DC

## 2022-02-27 MED ORDER — MONTELUKAST SODIUM 10 MG PO TABS: 10.0000 mg | ORAL_TABLET | Freq: Every day | ORAL | 3 refills | Status: DC

## 2022-02-27 MED ORDER — ENALAPRIL MALEATE 5 MG PO TABS: 5.0000 mg | ORAL_TABLET | Freq: Every day | ORAL | 0 refills | Status: DC

## 2022-02-27 MED ORDER — METOPROLOL SUCCINATE ER 25 MG PO TB24: 25.0000 mg | ORAL_TABLET | Freq: Every day | ORAL | 0 refills | Status: DC

## 2022-02-27 MED ORDER — DULOXETINE HCL 60 MG PO CPEP: 60.0000 mg | ORAL_CAPSULE | Freq: Every evening | ORAL | 0 refills | Status: DC

## 2022-02-27 MED ORDER — HYDROXYZINE PAMOATE 50 MG PO CAPS: 50.0000 mg | ORAL_CAPSULE | Freq: Three times a day (TID) | ORAL | 0 refills | Status: DC | PRN

## 2022-02-27 MED ORDER — BUPROPION HCL ER (SR) 100 MG PO TB12: 100.0000 mg | ORAL_TABLET | Freq: Two times a day (BID) | ORAL | 0 refills | Status: DC

## 2022-02-27 NOTE — Progress Notes (Signed)
?  ? ?Primary Care / Sports Medicine Office Visit ? ?Patient Information:  ?Patient ID: Eric Sanford, male DOB: Nov 05, 1976 Age: 46 y.o. MRN: [id number]  ? ?Eric Sanford is a pleasant 46 y.o. male presenting with the following: ? ?Chief Complaint  ?Patient presents with  ? Depression  ?  Feels like Anxiety and Depression are the same, feels like something bad is going to happen. Wasn't able to go to PSY, put a new referral in.   ? Insomnia  ?  Not good hasn't been able to sleep good.   ? Allergies  ?  Has bad allergies, states he has been wheezing a lot. States Dr in [place] was given him a shot to help with allergies.   ? ? ?Vitals:  ? 02/27/22 0810  ?BP: 138/88  ?Pulse: 74  ?SpO2: 96%  ? ?Vitals:  ? 02/27/22 0810  ?Weight: 273 lb (123.8 kg)  ?Height: 5\' 11"  (1.803 m)  ? ?Body mass index is 38.08 kg/m?. ? ?No results found.  ? ?Independent interpretation of notes and tests performed by another provider:  ? ?None ? ?Procedures performed:  ? ?None ? ?Pertinent History, Exam, Impression, and Recommendations:  ? ?Problem List Items Addressed This Visit   ? ?  ? Cardiovascular and Mediastinum  ? Primary hypertension  ?  Stable, controlled, continue with current regimen ? ?  ?  ? Relevant Medications  ? enalapril (VASOTEC) 5 MG tablet  ? metoprolol succinate (TOPROL-XL) 25 MG 24 hr tablet  ? simvastatin (ZOCOR) 20 MG tablet  ?  ? Respiratory  ? Allergic rhinitis  ?  Chronic condition, uncontrolled, initiate Singulair, adjunct Flonase, OTC antihistamine, reevaluate at return. ? ?  ?  ? Relevant Medications  ? montelukast (SINGULAIR) 10 MG tablet  ?  ? Other  ? Chronic midline low back pain without sciatica  ? Relevant Medications  ? buPROPion ER (WELLBUTRIN SR) 100 MG 12 hr tablet  ? DULoxetine (CYMBALTA) 60 MG capsule  ? Depression with anxiety - Primary  ?  Patient continues to be symptomatic for this chronic issue, states that symptoms from anxiety and depression standpoint have been somewhat improved with severe  worsening over the past 2 weeks that he cannot tie 20 specific aggravating factor, feels a sense of impending doom at times.  This did disrupt his sleep.  Spontaneously has slightly improved over the past few days.  Patient has yet to see psychiatry, new referral has been placed today, plan for continue duloxetine at 60 mg, initiate titrating course of bupropion, and follow-up scheduled. ? ?  ?  ? Relevant Medications  ? buPROPion ER (WELLBUTRIN SR) 100 MG 12 hr tablet  ? hydrOXYzine (VISTARIL) 50 MG capsule  ? DULoxetine (CYMBALTA) 60 MG capsule  ? Other Relevant Orders  ? Ambulatory referral to Psychiatry  ? Hyperlipidemia  ? Relevant Medications  ? enalapril (VASOTEC) 5 MG tablet  ? metoprolol succinate (TOPROL-XL) 25 MG 24 hr tablet  ? simvastatin (ZOCOR) 20 MG tablet  ? Psychophysiological insomnia  ? Relevant Medications  ? DULoxetine (CYMBALTA) 60 MG capsule  ? Gout  ? Relevant Medications  ? allopurinol (ZYLOPRIM) 300 MG tablet  ?  ? ?Orders & Medications ?Meds ordered this encounter  ?Medications  ? montelukast (SINGULAIR) 10 MG tablet  ?  Sig: Take 1 tablet (10 mg total) by mouth at bedtime.  ?  Dispense:  30 tablet  ?  Refill:  3  ? buPROPion ER (WELLBUTRIN SR) 100 MG  12 hr tablet  ?  Sig: Take 1 tablet (100 mg total) by mouth 2 (two) times daily.  ?  Dispense:  180 tablet  ?  Refill:  0  ? hydrOXYzine (VISTARIL) 50 MG capsule  ?  Sig: Take 1 capsule (50 mg total) by mouth 3 (three) times daily as needed for anxiety.  ?  Dispense:  30 capsule  ?  Refill:  0  ? allopurinol (ZYLOPRIM) 300 MG tablet  ?  Sig: Take 1 tablet (300 mg total) by mouth daily.  ?  Dispense:  90 tablet  ?  Refill:  0  ? DULoxetine (CYMBALTA) 60 MG capsule  ?  Sig: Take 1 capsule (60 mg total) by mouth every evening.  ?  Dispense:  90 capsule  ?  Refill:  0  ? enalapril (VASOTEC) 5 MG tablet  ?  Sig: Take 1 tablet (5 mg total) by mouth daily.  ?  Dispense:  90 tablet  ?  Refill:  0  ? metoprolol succinate (TOPROL-XL) 25 MG 24 hr  tablet  ?  Sig: Take 1 tablet (25 mg total) by mouth daily.  ?  Dispense:  90 tablet  ?  Refill:  0  ? simvastatin (ZOCOR) 20 MG tablet  ?  Sig: Take 1 tablet (20 mg total) by mouth daily.  ?  Dispense:  90 tablet  ?  Refill:  0  ? ?Orders Placed This Encounter  ?Procedures  ? Ambulatory referral to Psychiatry  ?  ? ?Return in about 6 years (around 02/28/2028).  ?  ? ?Jerrol Banana, MD ? ? Primary Care Sports Medicine ?[facility] ?[facility]  ? ?

## 2022-02-27 NOTE — Assessment & Plan Note (Signed)
Patient continues to be symptomatic for this chronic issue, states that symptoms from anxiety and depression standpoint have been somewhat improved with severe worsening over the past 2 weeks that he cannot tie 20 specific aggravating factor, feels a sense of impending doom at times.  This did disrupt his sleep.  Spontaneously has slightly improved over the past few days.  Patient has yet to see psychiatry, new referral has been placed today, plan for continue duloxetine at 60 mg, initiate titrating course of bupropion, and follow-up scheduled. ?

## 2022-02-27 NOTE — Assessment & Plan Note (Signed)
Stable, controlled, continue with current regimen ?

## 2022-02-27 NOTE — Patient Instructions (Signed)
-   Continue duloxetine daily ?- Start bupropion 1 tablet every morning daily x2 weeks ?- If anxiety symptoms persist, increase to bupropion 1 tablet in the morning and 1 tablet in the evening x2 weeks ?- If anxiety symptoms continue to persist, increase to 2 tablets bupropion in the morning and 1 tablet in the evening x2 weeks ?- Can continue to increase to maximum dose of 2 tablets bupropion in the morning and 2 tablets bupropion in the evening ?- Can use hydroxyzine (Vistaril) every 8 hours on as-needed basis for anxiety attacks ?- Start singular tablets daily ?- Use over-the-counter Flonase, Zyrtec daily ?- Can use albuterol inhaler on as-needed basis ?- Referral coordinator will contact you in regards to setting up a visit with psychiatry ?- Return for follow-up in 6 weeks ?

## 2022-02-27 NOTE — Assessment & Plan Note (Signed)
Chronic condition, uncontrolled, initiate Singulair, adjunct Flonase, OTC antihistamine, reevaluate at return. ?

## 2022-04-10 ENCOUNTER — Ambulatory Visit: Payer: Self-pay | Admitting: Family Medicine

## 2022-04-17 ENCOUNTER — Ambulatory Visit: Payer: Self-pay | Admitting: Family Medicine

## 2022-04-17 ENCOUNTER — Encounter: Payer: Self-pay | Admitting: Family Medicine

## 2022-04-17 VITALS — BP 126/86 | HR 78 | Ht 71.0 in | Wt 271.0 lb

## 2022-04-17 DIAGNOSIS — F418 Other specified anxiety disorders: Secondary | ICD-10-CM

## 2022-04-17 DIAGNOSIS — I10 Essential (primary) hypertension: Secondary | ICD-10-CM

## 2022-04-17 DIAGNOSIS — F5104 Psychophysiologic insomnia: Secondary | ICD-10-CM

## 2022-04-17 NOTE — Patient Instructions (Signed)
-   Can take hydroxyzine and melatonin as-needed - Continue all other medications daily - Continue healthy lifestyle changes as you have been - Return in 3 months

## 2022-04-17 NOTE — Assessment & Plan Note (Signed)
Patient has been dosing his cardiac medications daily, is asymptomatic from a cardiopulmonary standpoint.  He has been attempting to make healthy lifestyle changes primarily from an exercise standpoint.  I have encouraged him to advance both dietary and lifestyle changes where he is able to do so.

## 2022-04-17 NOTE — Progress Notes (Signed)
     Primary Care / Sports Medicine Office Visit  Patient Information:  Patient ID: Eric Sanford, male DOB: 09/29/1976 Age: 46 y.o. MRN: [id number]   Eric Sanford is a pleasant 46 y.o. male presenting with the following:  Chief Complaint  Patient presents with   Depression    Pt states he is feeling good on his medicaitons. Unable to go to psy at the moment   Anxiety    Vitals:   04/17/22 0854  BP: 126/86  Pulse: 78  SpO2: 97%   Vitals:   04/17/22 0854  Weight: 271 lb (122.9 kg)  Height: 5\' 11"  (1.803 m)   Body mass index is 37.8 kg/m.  No results found.   Independent interpretation of notes and tests performed by another provider:   None  Procedures performed:   None  Pertinent History, Exam, Impression, and Recommendations:   Problem List Items Addressed This Visit       Cardiovascular and Mediastinum   Primary hypertension    Patient has been dosing his cardiac medications daily, is asymptomatic from a cardiopulmonary standpoint.  He has been attempting to make healthy lifestyle changes primarily from an exercise standpoint.  I have encouraged him to advance both dietary and lifestyle changes where he is able to do so.        Other   Depression with anxiety - Primary    Patient has demonstrated excellent interval decrease in both his PHQ and GAD scores following incorporation of Wellbutrin SR 100 mg twice daily.  That being said, patient does endorse taking this medication every other day attributing his increased mood to only needing the medication infrequently.  I have discussed the importance of every day dosing to achieve appropriate medication level and given his residual scores, he has a room for further improvement.  We will hold from increasing his medication strength at this time however from a medication management standpoint I did discuss the importance of daily compliance with his medications.  We will reassess symptoms in 3 months following  this change.      Psychophysiological insomnia    Patient has noted improved sleep since recent medication change for his depression and anxiety.  As such, I have advised him to dose melatonin on a as needed basis for any insomnia.        Orders & Medications No orders of the defined types were placed in this encounter.  No orders of the defined types were placed in this encounter.    Return in about 3 months (around 07/18/2022).     07/20/2022, MD   Primary Care Sports Medicine [facility]

## 2022-04-17 NOTE — Assessment & Plan Note (Signed)
Patient has demonstrated excellent interval decrease in both his PHQ and GAD scores following incorporation of Wellbutrin SR 100 mg twice daily.  That being said, patient does endorse taking this medication every other day attributing his increased mood to only needing the medication infrequently.  I have discussed the importance of every day dosing to achieve appropriate medication level and given his residual scores, he has a room for further improvement.  We will hold from increasing his medication strength at this time however from a medication management standpoint I did discuss the importance of daily compliance with his medications.  We will reassess symptoms in 3 months following this change.

## 2022-04-17 NOTE — Assessment & Plan Note (Signed)
Patient has noted improved sleep since recent medication change for his depression and anxiety.  As such, I have advised him to dose melatonin on a as needed basis for any insomnia.

## 2022-04-25 ENCOUNTER — Ambulatory Visit: Payer: Self-pay | Admitting: Psychiatry

## 2022-07-18 ENCOUNTER — Ambulatory Visit: Payer: Self-pay | Admitting: Family Medicine

## 2022-07-23 ENCOUNTER — Encounter: Payer: Self-pay | Admitting: Family Medicine

## 2022-08-10 IMAGING — CT CT HEAD W/O CM
3 series · 16 of 47 positions shown, 19 images · non-contrast
Comparison: None.

CLINICAL DATA: Headache

EXAM:
CT HEAD WITHOUT CONTRAST
TECHNIQUE: Contiguous axial images were obtained from the base of the skull
through the vertex without intravenous contrast.

[Series 2: head wo · axial · 0.47mm/px · z∈[-87,+48]mm · 10 of 33 slices shown, 13 images]
[im 3/33  brain]
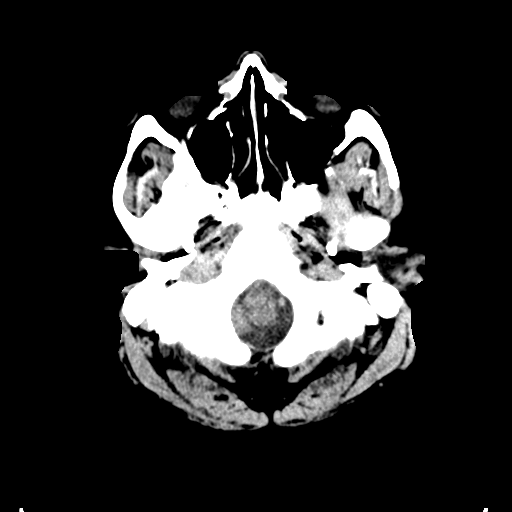
[im 3/33  bone]
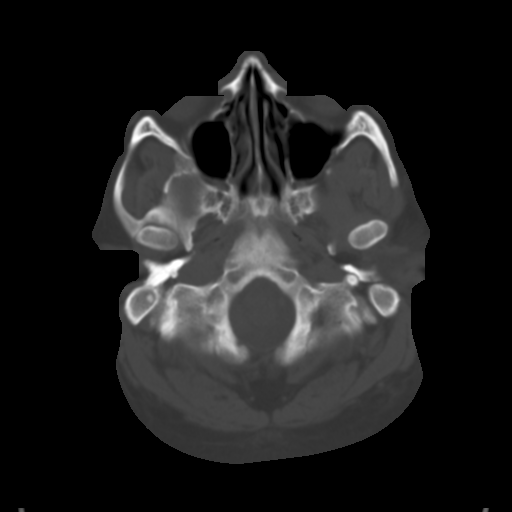
[im 6/33  brain]
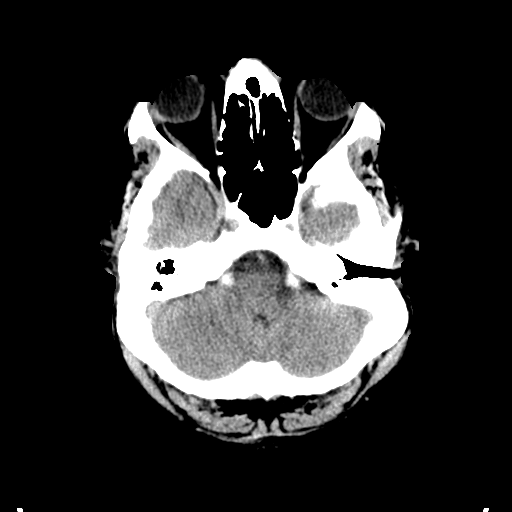
[im 9/33  brain]
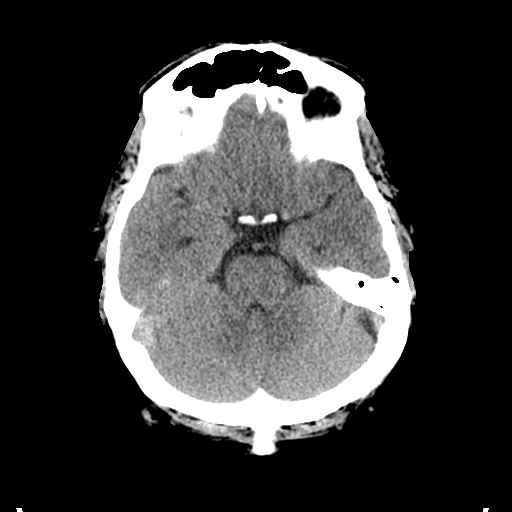
[im 12/33  brain]
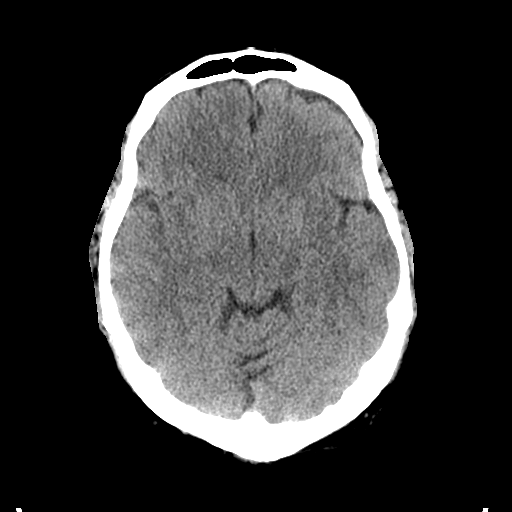
[im 15/33  brain]
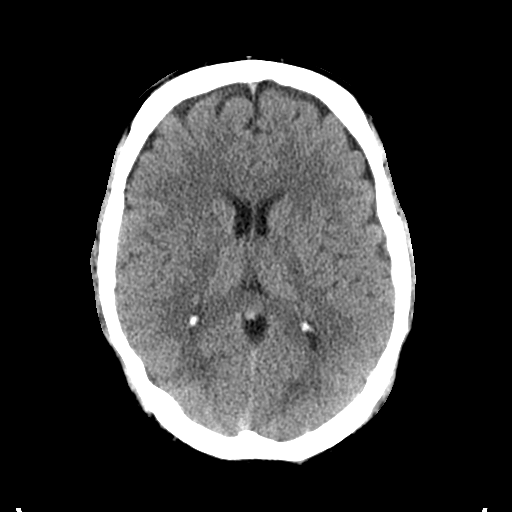
[im 15/33  bone]
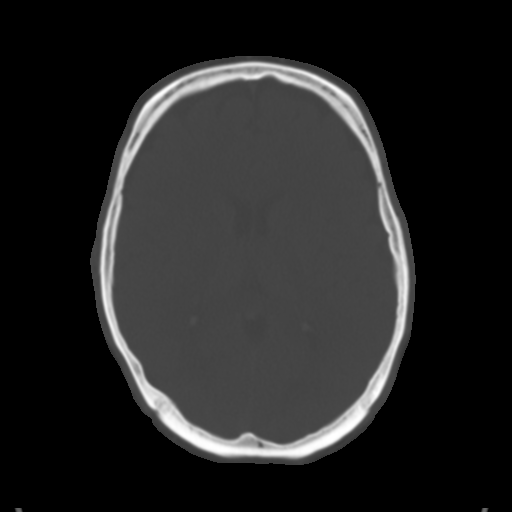
[im 18/33  brain]
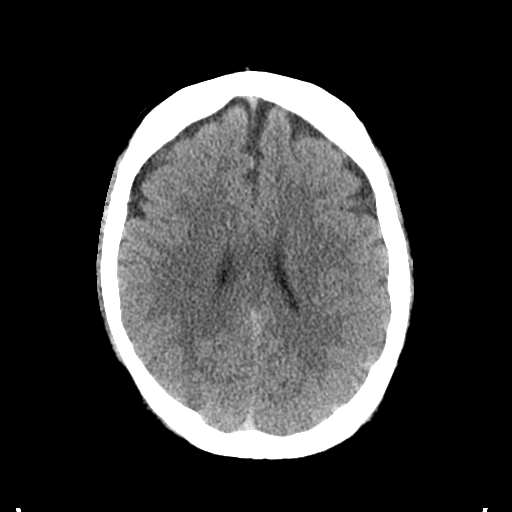
[im 21/33  brain]
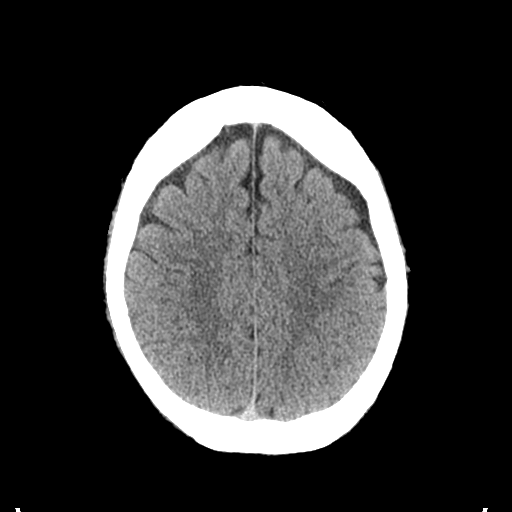
[im 25/33  brain]
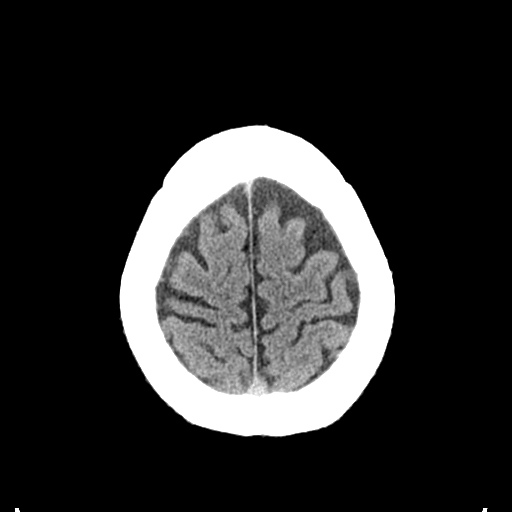
[im 27/33  brain]
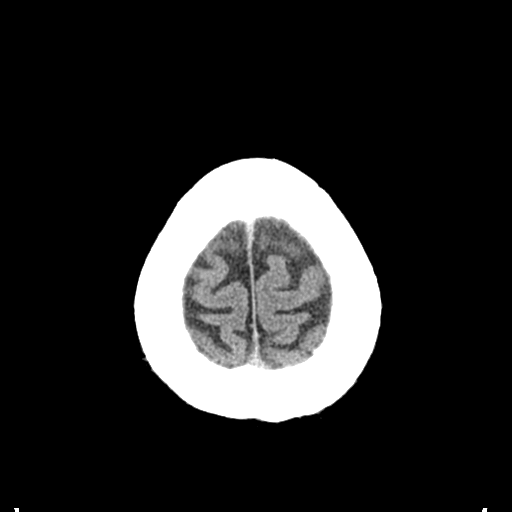
[im 27/33  bone]
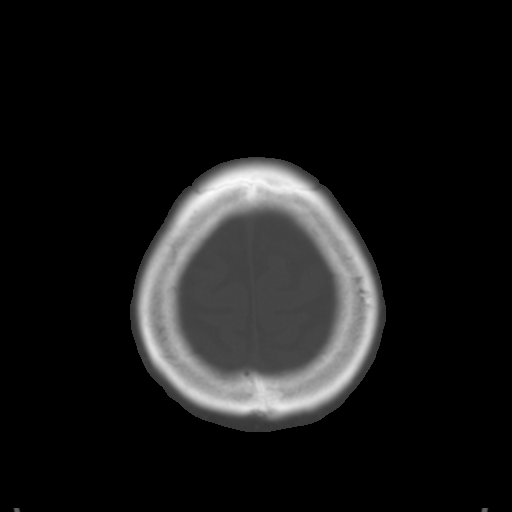
[im 30/33  brain]
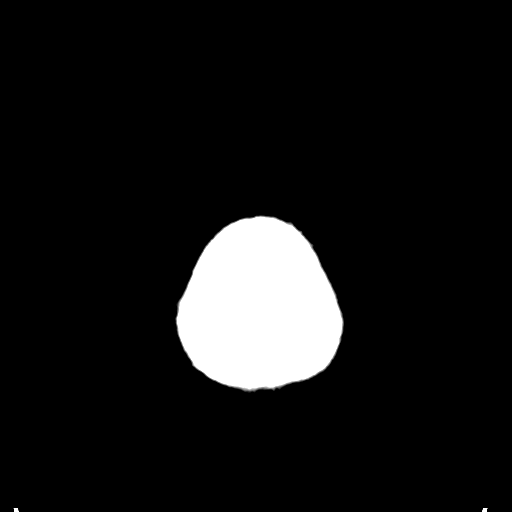

[Series 4: coronal soft tissue · coronal · 0.31mm/px · 3 of 68 slices shown]
[im 23/68  brain]
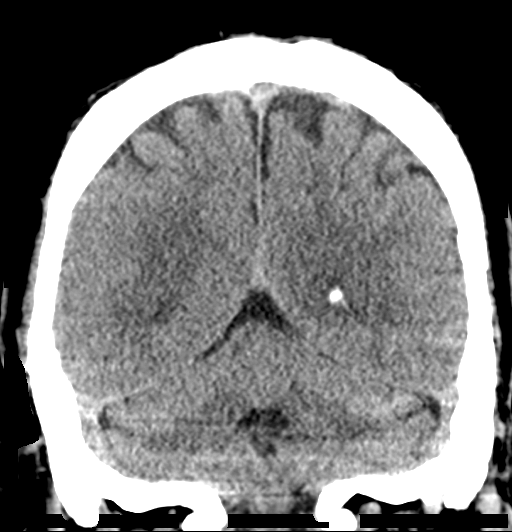
[im 30/68  brain]
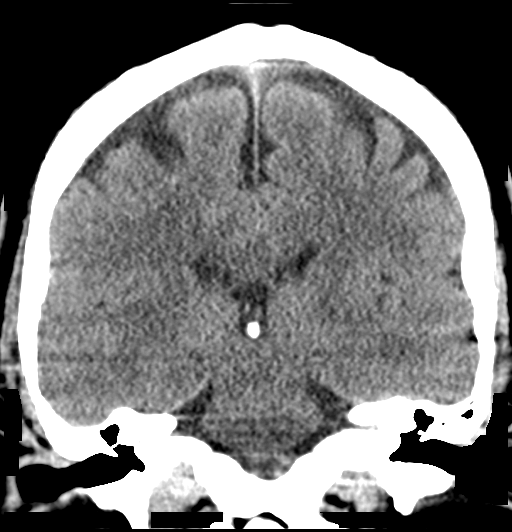
[im 38/68  brain]
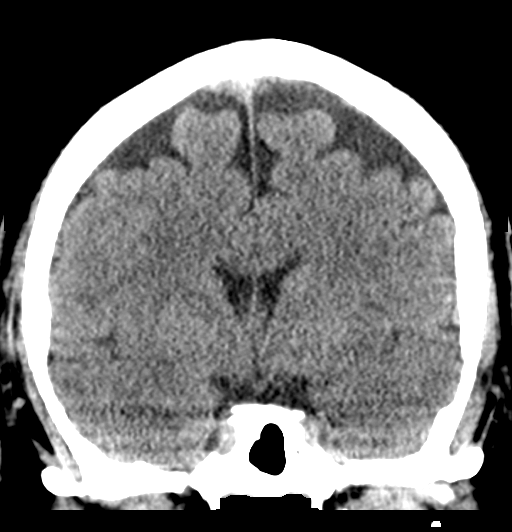

[Series 5: sagittal soft tissue · sagittal · 0.32mm/px · 3 of 54 slices shown]
[im 18/54  brain]
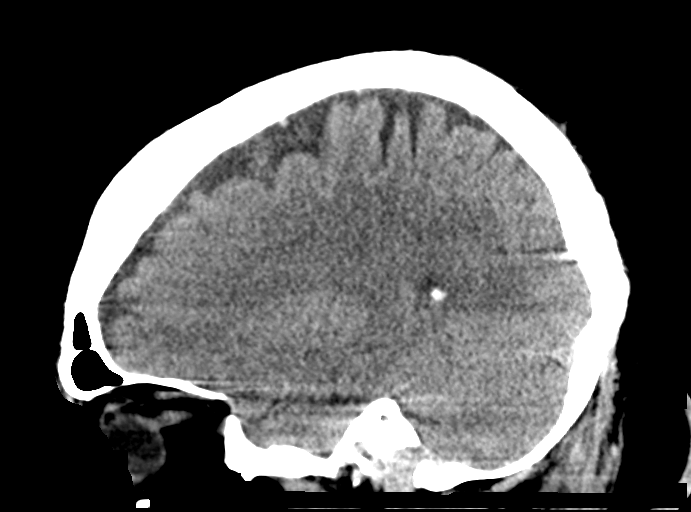
[im 27/54  brain]
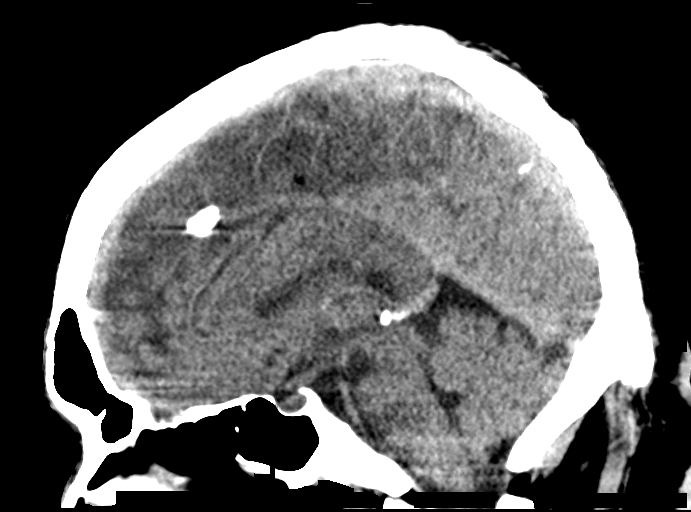
[im 36/54  brain]
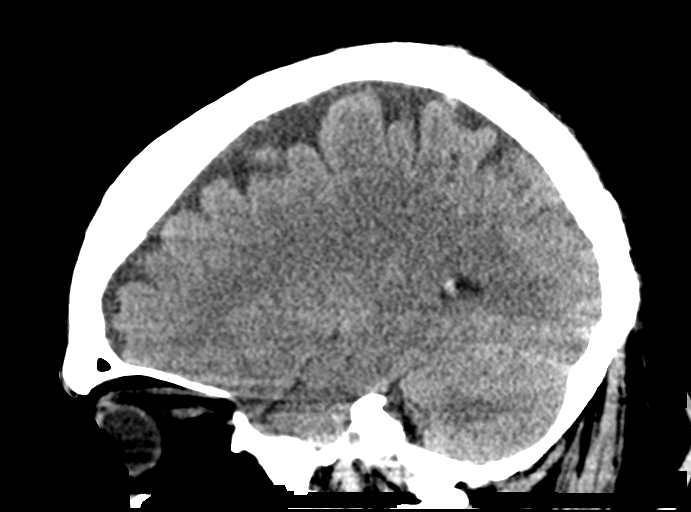

[16 of 47 positions shown; findings below may reference images not displayed]

FINDINGS: Brain: No acute intracranial abnormality. Specifically, no
hemorrhage, hydrocephalus, mass lesion, acute infarction, or
significant intracranial injury.

Vascular: No hyperdense vessel or unexpected calcification.

Skull: No acute calvarial abnormality.

Sinuses/Orbits: No acute findings

Other: None
IMPRESSION: Normal study.

## 2022-08-10 IMAGING — CT CT RENAL STONE PROTOCOL
2 of 4 series · 17 of 46 positions shown, 19 images · non-contrast
Comparison: None.

CLINICAL DATA: Right flank pain

EXAM:
CT ABDOMEN AND PELVIS WITHOUT CONTRAST
TECHNIQUE: Multidetector CT imaging of the abdomen and pelvis was performed
following the standard protocol without IV contrast.

[Series 2: axial st · axial · 0.80mm/px · z∈[-516,-66]mm · 14 of 102 slices shown, 16 images]
[im 6/102  soft-tissue]
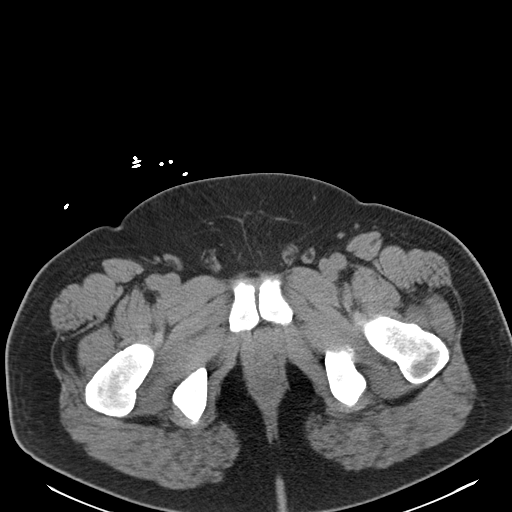
[im 6/102  bone]
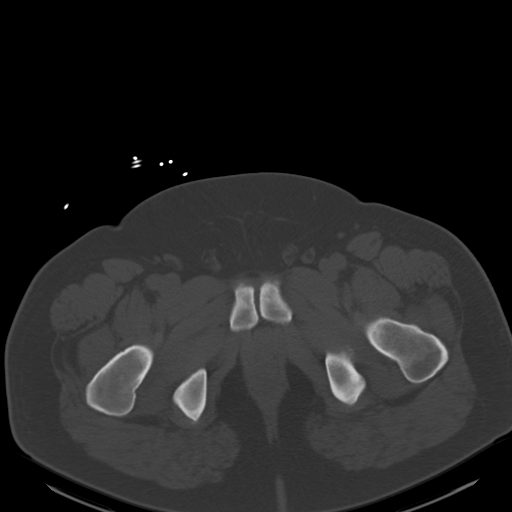
[im 12/102  soft-tissue]
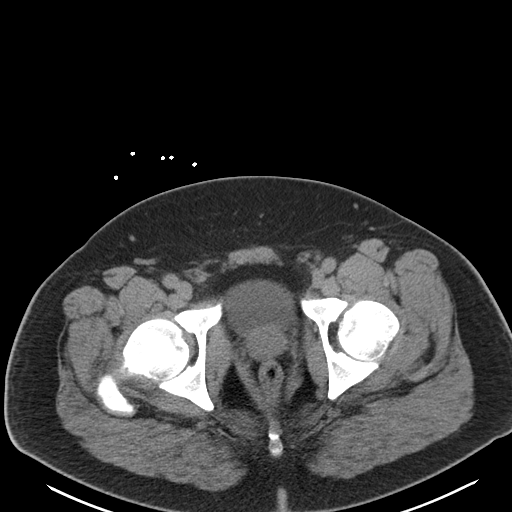
[im 23/102  soft-tissue]
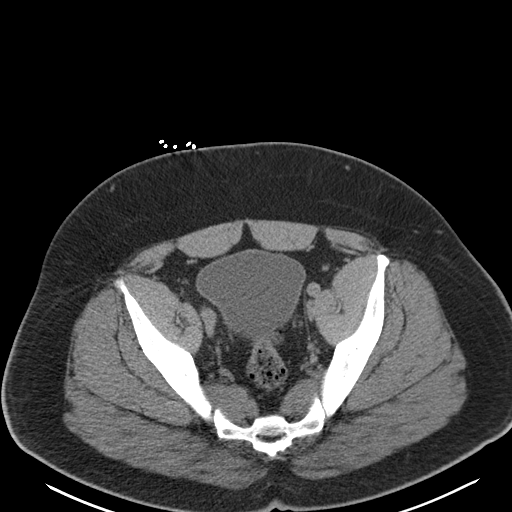
[im 29/102  soft-tissue]
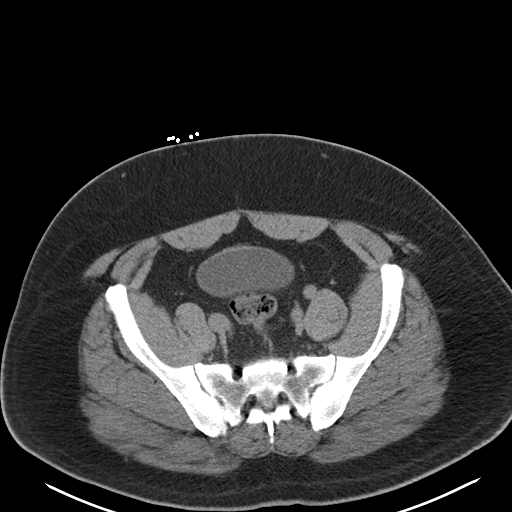
[im 34/102  soft-tissue]
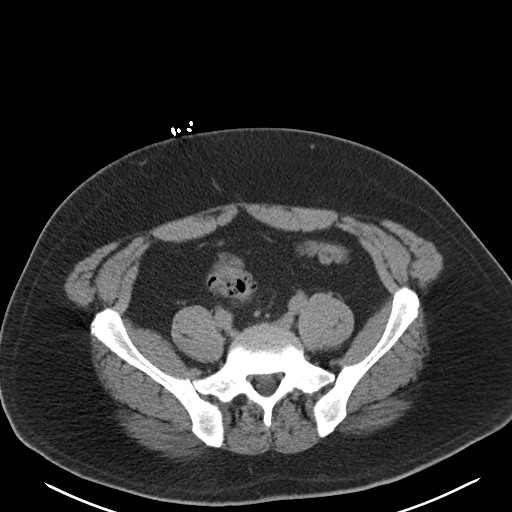
[im 40/102  soft-tissue]
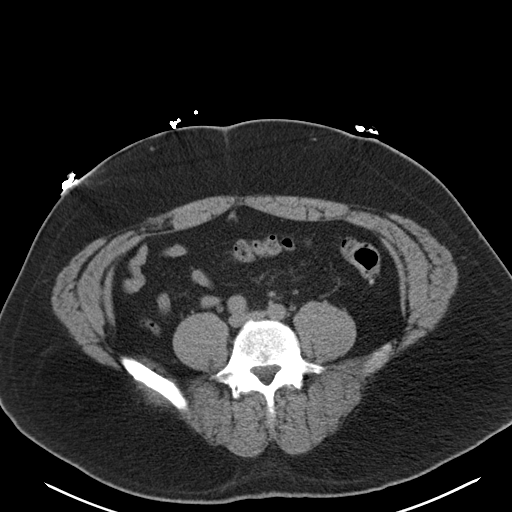
[im 45/102  soft-tissue]
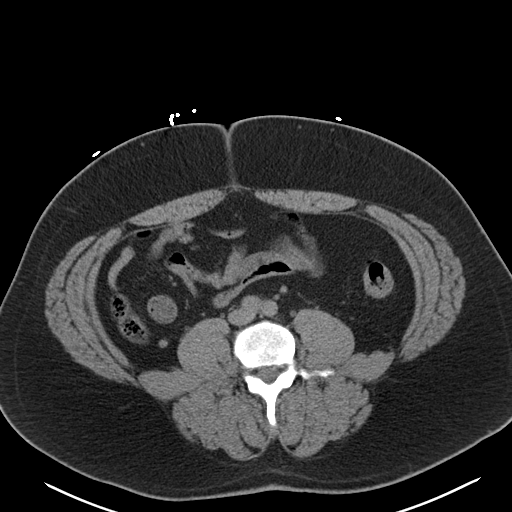
[im 57/102  soft-tissue]
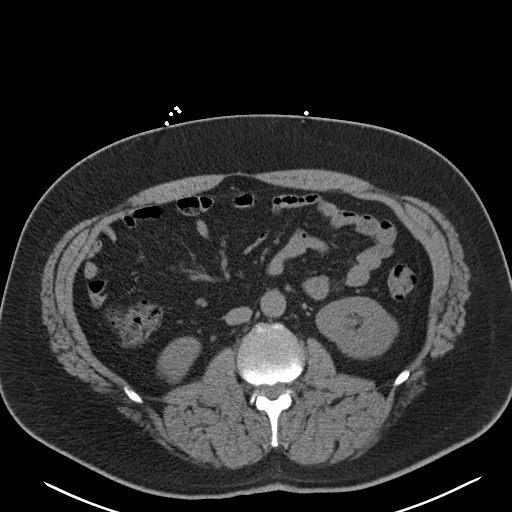
[im 62/102  soft-tissue]
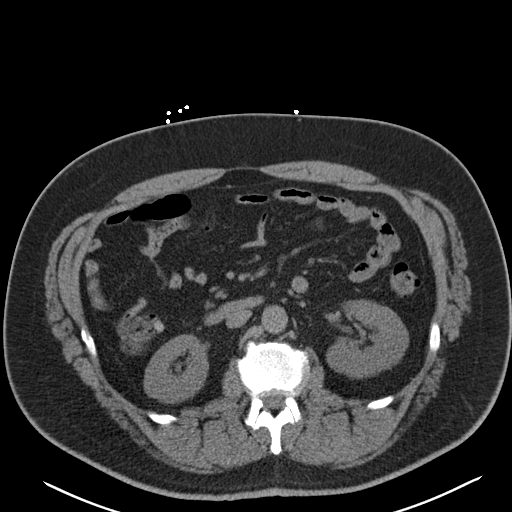
[im 62/102  bone]
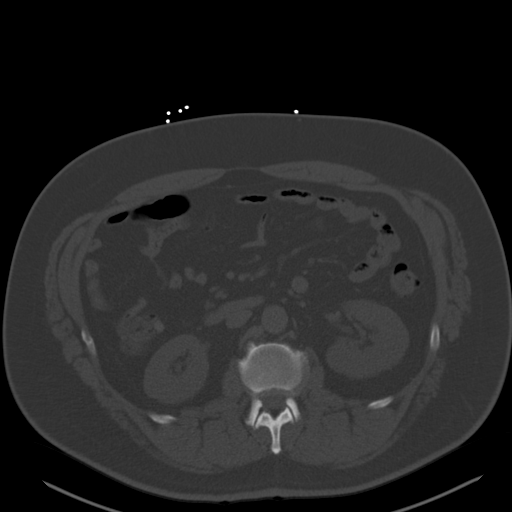
[im 68/102  soft-tissue]
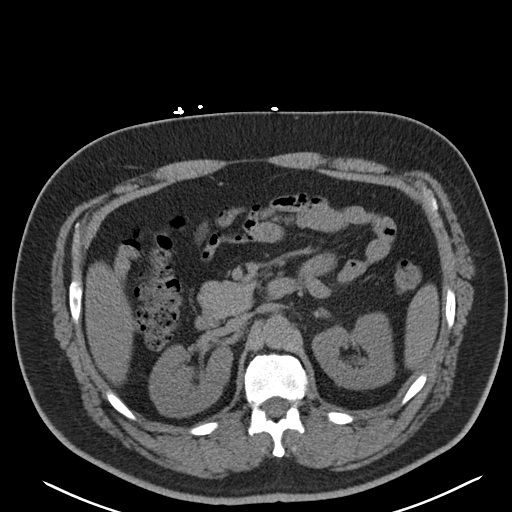
[im 73/102  soft-tissue]
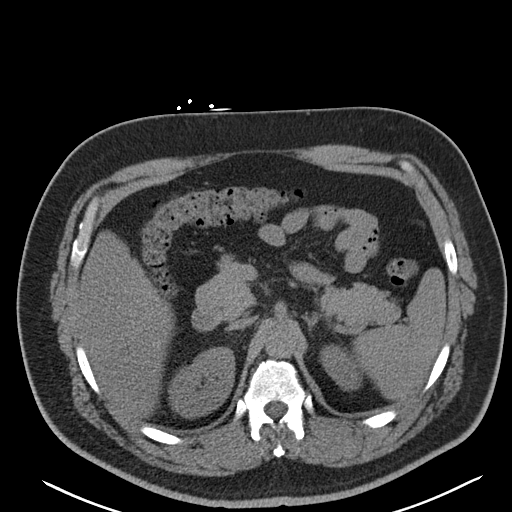
[im 79/102  soft-tissue]
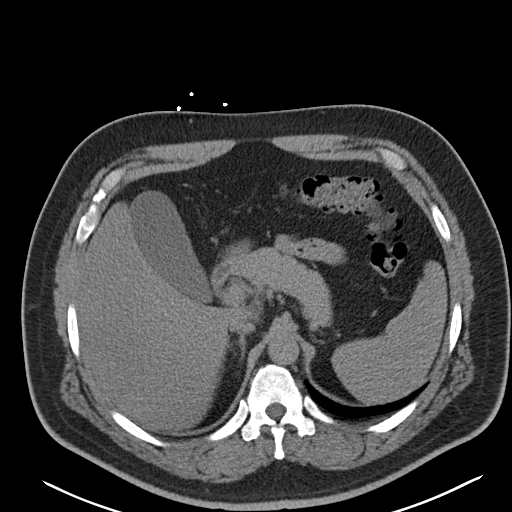
[im 90/102  soft-tissue]
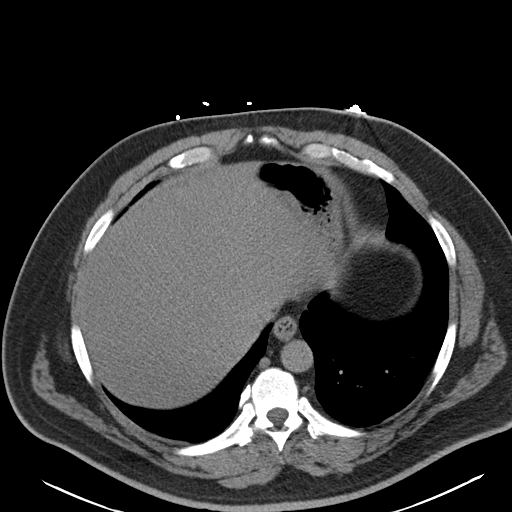
[im 96/102  soft-tissue]
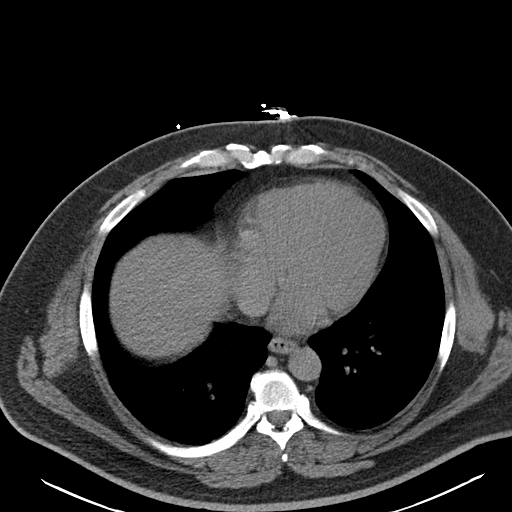

[Series 4: coronal · coronal · 0.85mm/px · 3 of 156 slices shown]
[im 52/156  soft-tissue]
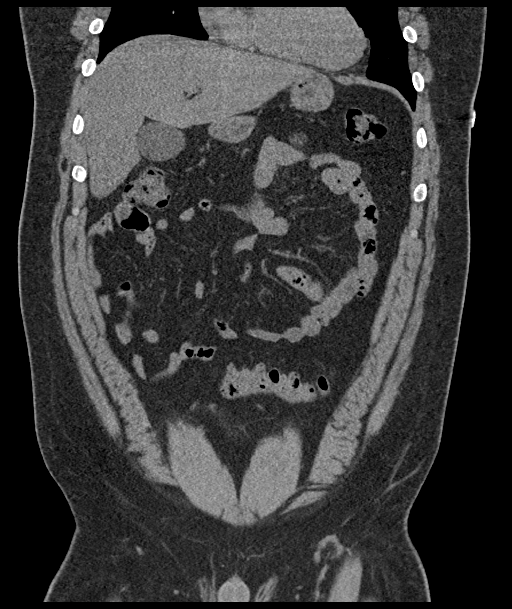
[im 69/156  soft-tissue]
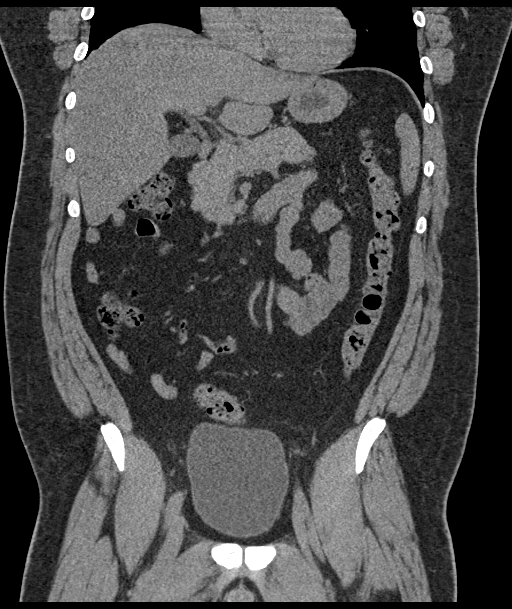
[im 87/156  soft-tissue]
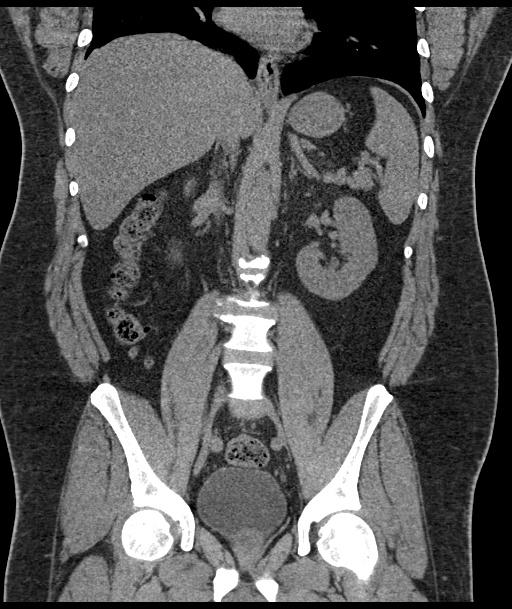

[17 of 46 positions shown; findings below may reference images not displayed]

FINDINGS: Lower chest: Lung bases are clear. No effusions. Heart is normal
size.

Hepatobiliary: Diffuse low-density throughout the liver compatible
with fatty infiltration. No focal abnormality. Gallbladder
unremarkable.

Pancreas: No focal abnormality or ductal dilatation.

Spleen: No focal abnormality.  Normal size.

Adrenals/Urinary Tract: No adrenal abnormality. No focal renal
abnormality. No stones or hydronephrosis. Urinary bladder is
unremarkable.

Stomach/Bowel: Normal appendix. Stomach, large and small bowel
grossly unremarkable.

Vascular/Lymphatic: No evidence of aneurysm or adenopathy.

Reproductive: No visible focal abnormality.

Other: No free fluid or free air.

Musculoskeletal: No acute bony abnormality.
IMPRESSION: No renal or ureteral stones.  No hydronephrosis.

Hepatic steatosis.

No acute findings in the abdomen or pelvis.

## 2022-09-18 ENCOUNTER — Ambulatory Visit (INDEPENDENT_AMBULATORY_CARE_PROVIDER_SITE_OTHER): Payer: 59 | Admitting: Family Medicine

## 2022-09-18 ENCOUNTER — Ambulatory Visit
Admission: RE | Admit: 2022-09-18 | Discharge: 2022-09-18 | Disposition: A | Discharge status: Home / Self Care | Payer: Commercial Managed Care - HMO | Source: Ambulatory Visit | Attending: Family Medicine | Admitting: Family Medicine

## 2022-09-18 ENCOUNTER — Ambulatory Visit
Admission: RE | Admit: 2022-09-18 | Discharge: 2022-09-18 | Disposition: A | Discharge status: Home / Self Care | Payer: Commercial Managed Care - HMO | Attending: Family Medicine | Admitting: Family Medicine

## 2022-09-18 ENCOUNTER — Encounter: Payer: Self-pay | Admitting: Family Medicine

## 2022-09-18 VITALS — BP 132/84 | HR 76 | Ht 71.0 in | Wt 266.0 lb

## 2022-09-18 DIAGNOSIS — M542 Cervicalgia: Secondary | ICD-10-CM

## 2022-09-18 DIAGNOSIS — E7800 Pure hypercholesterolemia, unspecified: Secondary | ICD-10-CM

## 2022-09-18 DIAGNOSIS — F418 Other specified anxiety disorders: Secondary | ICD-10-CM

## 2022-09-18 DIAGNOSIS — F419 Anxiety disorder, unspecified: Secondary | ICD-10-CM

## 2022-09-18 DIAGNOSIS — I10 Essential (primary) hypertension: Secondary | ICD-10-CM | POA: Diagnosis not present

## 2022-09-18 DIAGNOSIS — M109 Gout, unspecified: Secondary | ICD-10-CM

## 2022-09-18 DIAGNOSIS — Z1159 Encounter for screening for other viral diseases: Secondary | ICD-10-CM

## 2022-09-18 DIAGNOSIS — M47812 Spondylosis without myelopathy or radiculopathy, cervical region: Secondary | ICD-10-CM | POA: Insufficient documentation

## 2022-09-18 DIAGNOSIS — H9312 Tinnitus, left ear: Secondary | ICD-10-CM

## 2022-09-18 DIAGNOSIS — Z114 Encounter for screening for human immunodeficiency virus [HIV]: Secondary | ICD-10-CM

## 2022-09-18 DIAGNOSIS — F32A Depression, unspecified: Secondary | ICD-10-CM

## 2022-09-18 MED ORDER — HYDROXYZINE PAMOATE 50 MG PO CAPS: 50.0000 mg | ORAL_CAPSULE | Freq: Three times a day (TID) | ORAL | 0 refills | Status: AC | PRN

## 2022-09-18 MED ORDER — BUPROPION HCL ER (SR) 100 MG PO TB12: 100.0000 mg | ORAL_TABLET | Freq: Two times a day (BID) | ORAL | 0 refills | Status: DC

## 2022-09-18 MED ORDER — METOPROLOL SUCCINATE ER 25 MG PO TB24: 25.0000 mg | ORAL_TABLET | Freq: Every day | ORAL | 0 refills | Status: AC

## 2022-09-18 MED ORDER — CYCLOBENZAPRINE HCL 10 MG PO TABS: 10.0000 mg | ORAL_TABLET | Freq: Every evening | ORAL | 0 refills | Status: DC | PRN

## 2022-09-18 MED ORDER — ENALAPRIL MALEATE 5 MG PO TABS: 5.0000 mg | ORAL_TABLET | Freq: Every day | ORAL | 0 refills | Status: DC

## 2022-09-18 MED ORDER — SIMVASTATIN 20 MG PO TABS: 20.0000 mg | ORAL_TABLET | Freq: Every day | ORAL | 0 refills | Status: AC

## 2022-09-18 MED ORDER — ALLOPURINOL 300 MG PO TABS: 300.0000 mg | ORAL_TABLET | Freq: Every day | ORAL | 0 refills | Status: DC

## 2022-09-18 NOTE — Assessment & Plan Note (Signed)
See additional assessment(s) for plan details.  In the setting of left ear tinnitus, crepitus, muscular tightness.  In addition to home-based exercises, cervical spine x-rays, trial of nightly cyclobenzaprine prescribed today.

## 2022-09-18 NOTE — Assessment & Plan Note (Signed)
Chronic issue, associated with ear pain, in the setting of concomitant allergic rhinitis.  He describes muscular tightness about the neck without weakness or paresthesias in the upper extremities, crepitus with neck motions.  Prior referral to ENT had been placed however he was unable to attend due to insurance related barriers.  Examination today reveals benign tympanic membranes bilaterally, canals benign, no tragal tenderness, nasopharynx and oropharynx benign, no sinus tenderness, no lymphadenopathy.  He has tenderness about the right sternocleidomastoid, upper trapezius, levator scapular regions.  Negative Spurling's test.  Patient's clinical history and findings, particular given allergic rhinitis history, raise concern for possible ENT etiology versus musculoskeletal.  A new referral to ENT has been placed at patient's request, additionally home-based cervical spine exercises and cervical spine x-ray ordered.

## 2022-09-18 NOTE — Assessment & Plan Note (Signed)
Patient had been out of medication, BP readings today reflect the same.  Patient was unaware he could contact us for refills, refills provided and risk stratification labs obtained.

## 2022-09-18 NOTE — Assessment & Plan Note (Signed)
Patient has been out of Wellbutrin, was not aware that he could contact us for refills, is amenable for a restart of Wellbutrin, prescribed today.

## 2022-09-18 NOTE — Assessment & Plan Note (Signed)
Labs ordered.

## 2022-09-18 NOTE — Progress Notes (Signed)
Primary Care / Sports Medicine Office Visit  Patient Information:  Patient ID: Eric Sanford, male DOB: Nov 14, 1975 Age: 46 y.o. MRN: [id number]   Eric Sanford is a pleasant 46 y.o. male presenting with the following:  Chief Complaint  Patient presents with   Hypertension    Vitals:   09/18/22 0811  BP: 132/84  Pulse: 76  SpO2: 98%   Vitals:   09/18/22 0811  Weight: 266 lb (120.7 kg)  Height: 5\' 11"  (1.803 m)   Body mass index is 37.1 kg/m.  No results found.   Independent interpretation of notes and tests performed by another provider:   None  Procedures performed:   None  Pertinent History, Exam, Impression, and Recommendations:   Problem List Items Addressed This Visit       Cardiovascular and Mediastinum   Primary hypertension - Primary    Patient had been out of medication, BP readings today reflect the same.  Patient was unaware he could contact for refills, refills provided and risk stratification labs obtained.      Relevant Medications   enalapril (VASOTEC) 5 MG tablet   metoprolol succinate (TOPROL-XL) 25 MG 24 hr tablet   simvastatin (ZOCOR) 20 MG tablet   Other Relevant Orders   Lipid panel   Comprehensive metabolic panel   CBC   TSH     Other   Depression with anxiety    Patient has been out of Wellbutrin, was not aware that he could contact us for refills, is amenable for a restart of Wellbutrin, prescribed today.      Relevant Medications   hydrOXYzine (VISTARIL) 50 MG capsule   buPROPion ER (WELLBUTRIN SR) 100 MG 12 hr tablet   Other Relevant Orders   TSH   Hyperlipidemia    Labs ordered.      Relevant Medications   enalapril (VASOTEC) 5 MG tablet   metoprolol succinate (TOPROL-XL) 25 MG 24 hr tablet   simvastatin (ZOCOR) 20 MG tablet   Other Relevant Orders   Lipid panel   Comprehensive metabolic panel   Tinnitus of left ear    Chronic issue, associated with ear pain, in the setting of concomitant allergic  rhinitis.  He describes muscular tightness about the neck without weakness or paresthesias in the upper extremities, crepitus with neck motions.  Prior referral to ENT had been placed however he was unable to attend due to insurance related barriers.  Examination today reveals benign tympanic membranes bilaterally, canals benign, no tragal tenderness, nasopharynx and oropharynx benign, no sinus tenderness, no lymphadenopathy.  He has tenderness about the right sternocleidomastoid, upper trapezius, levator scapular regions.  Negative Spurling's test.  Patient's clinical history and findings, particular given allergic rhinitis history, raise concern for possible ENT etiology versus musculoskeletal.  A new referral to ENT has been placed at patient's request, additionally home-based cervical spine exercises and cervical spine x-ray ordered.      Relevant Orders   Ambulatory referral to ENT   Anxiety and depression   Relevant Medications   allopurinol (ZYLOPRIM) 300 MG tablet   hydrOXYzine (VISTARIL) 50 MG capsule   buPROPion ER (WELLBUTRIN SR) 100 MG 12 hr tablet   Cervicalgia    See additional assessment(s) for plan details.  In the setting of left ear tinnitus, crepitus, muscular tightness.  In addition to home-based exercises, cervical spine x-rays, trial of nightly cyclobenzaprine prescribed today.      Relevant Medications   cyclobenzaprine (FLEXERIL) 10 MG tablet  Other Relevant Orders   DG Cervical Spine Complete   Other Visit Diagnoses     Screening for HIV (human immunodeficiency virus)       Relevant Orders   HIV Antibody (routine testing w rflx)   Need for hepatitis C screening test       Relevant Orders   Hepatitis C antibody   Anxiety       Relevant Medications   hydrOXYzine (VISTARIL) 50 MG capsule   buPROPion ER (WELLBUTRIN SR) 100 MG 12 hr tablet        Orders & Medications Meds ordered this encounter  Medications   allopurinol (ZYLOPRIM) 300 MG tablet    Sig:  Take 1 tablet (300 mg total) by mouth daily.    Dispense:  90 tablet    Refill:  0   enalapril (VASOTEC) 5 MG tablet    Sig: Take 1 tablet (5 mg total) by mouth daily.    Dispense:  90 tablet    Refill:  0   hydrOXYzine (VISTARIL) 50 MG capsule    Sig: Take 1 capsule (50 mg total) by mouth 3 (three) times daily as needed for anxiety.    Dispense:  30 capsule    Refill:  0   metoprolol succinate (TOPROL-XL) 25 MG 24 hr tablet    Sig: Take 1 tablet (25 mg total) by mouth daily.    Dispense:  90 tablet    Refill:  0   simvastatin (ZOCOR) 20 MG tablet    Sig: Take 1 tablet (20 mg total) by mouth daily.    Dispense:  90 tablet    Refill:  0   buPROPion ER (WELLBUTRIN SR) 100 MG 12 hr tablet    Sig: Take 1 tablet (100 mg total) by mouth 2 (two) times daily.    Dispense:  180 tablet    Refill:  0   cyclobenzaprine (FLEXERIL) 10 MG tablet    Sig: Take 1 tablet (10 mg total) by mouth at bedtime as needed for muscle spasms.    Dispense:  30 tablet    Refill:  0   Orders Placed This Encounter  Procedures   DG Cervical Spine Complete   Lipid panel   Comprehensive metabolic panel   CBC   HIV Antibody (routine testing w rflx)   Hepatitis C antibody   TSH   Ambulatory referral to ENT     Return in about 2 months (around 11/18/2022).     [name], MD   [facility]

## 2022-09-18 NOTE — Patient Instructions (Addendum)
-   Obtain fasting labs with orders provided (can have water or black coffee but otherwise no food or drink x 8 hours before labs) - Review information provided -Obtain x-rays - Perform home exercises (attached to MyChart) -Try muscle relaxer nightly as needed for neck muscle tightness - Referral coordinator will contact you to schedule visit with ear nose and throat doctor (ENT) - Return for follow-up in 2 months

## 2022-10-05 ENCOUNTER — Telehealth: Payer: Self-pay | Admitting: Family Medicine

## 2022-10-05 NOTE — Telephone Encounter (Signed)
Copied from CRM [id number]. Topic: Referral - Request for Referral >> Oct 05, 2022  2:24 PM Everette C wrote: Has patient seen PCP for this complaint? Yes.   *If NO, is insurance requiring patient see PCP for this issue before PCP can refer them? Referral for which specialty: Otolaryngology  Preferred provider/office: Dr. Vernie Murders Reason for referral: Left ear concern -    the patient has been directed to request that their referral be resubmitted

## 2022-10-05 NOTE — Telephone Encounter (Signed)
Can you assist with this ? 

## 2022-11-19 ENCOUNTER — Ambulatory Visit (INDEPENDENT_AMBULATORY_CARE_PROVIDER_SITE_OTHER): Payer: BLUE CROSS/BLUE SHIELD | Admitting: Family Medicine

## 2022-11-19 ENCOUNTER — Encounter: Payer: Self-pay | Admitting: Family Medicine

## 2022-11-19 VITALS — BP 140/100 | HR 78 | Ht 71.0 in | Wt 270.0 lb

## 2022-11-19 DIAGNOSIS — M542 Cervicalgia: Secondary | ICD-10-CM

## 2022-11-19 DIAGNOSIS — I10 Essential (primary) hypertension: Secondary | ICD-10-CM

## 2022-11-19 DIAGNOSIS — F418 Other specified anxiety disorders: Secondary | ICD-10-CM | POA: Diagnosis not present

## 2022-11-19 DIAGNOSIS — M47812 Spondylosis without myelopathy or radiculopathy, cervical region: Secondary | ICD-10-CM | POA: Diagnosis not present

## 2022-11-19 DIAGNOSIS — F5104 Psychophysiologic insomnia: Secondary | ICD-10-CM

## 2022-11-19 MED ORDER — ENALAPRIL-HYDROCHLOROTHIAZIDE 10-25 MG PO TABS: 1.0000 | ORAL_TABLET | Freq: Every day | ORAL | 0 refills | Status: DC

## 2022-11-19 MED ORDER — METHOCARBAMOL 750 MG PO TABS: 750.0000 mg | ORAL_TABLET | Freq: Four times a day (QID) | ORAL | 3 refills | Status: AC | PRN

## 2022-11-19 MED ORDER — CELECOXIB 100 MG PO CAPS: 100.0000 mg | ORAL_CAPSULE | Freq: Two times a day (BID) | ORAL | 1 refills | Status: DC | PRN

## 2022-11-19 MED ORDER — BUPROPION HCL ER (SR) 150 MG PO TB12: 150.0000 mg | ORAL_TABLET | Freq: Two times a day (BID) | ORAL | 0 refills | Status: AC

## 2022-11-19 NOTE — Patient Instructions (Addendum)
-  Start new blood pressure medication - Take new dose of bupropion  - Continue other medications for blood pressure, gout, and cholesterol - Can use new muscle relaxer and antiinflammatory as-needed - Referral coordinator will contact you to schedule visit with spine group - Find new primary care doctor that you can go to without having to pay out pocket - Contact for any questions

## 2022-11-19 NOTE — Progress Notes (Signed)
Primary Care / Sports Medicine Office Visit  Patient Information:  Patient ID: Eric Sanford, male DOB: Oct 11, 1976 Age: 47 y.o. MRN: [id number]   MCKALE Sanford is a pleasant 47 y.o. male presenting with the following:  Chief Complaint  Patient presents with   Hypertension    Vitals:   11/19/22 0948 11/19/22 0953  BP: (!) 130/100 (!) 140/100  Pulse: 78   SpO2: 98%    Vitals:   11/19/22 0948  Weight: 270 lb (122.5 kg)  Height: 5\' 11"  (1.803 m)   Body mass index is 37.66 kg/m.  No results found.   Independent interpretation of notes and tests performed by another provider:   None  Procedures performed:   None  Pertinent History, Exam, Impression, and Recommendations:   Eric Sanford was seen today for hypertension.  Primary hypertension Assessment & Plan: Chronic, uncontrolled, denies cardiopulmonary complaints. Examination benign, will titrate to combo ACE-diuretic. He brings up insurance barriers so 3 months of medication has been written to allow for him to establish with new PCP.  Orders: -     Enalapril-hydroCHLOROthiazide; Take 1 tablet by mouth daily.  Dispense: 90 tablet; Refill: 0  Depression with anxiety Overview:    11/19/2022    9:48 AM 09/18/2022    8:13 AM 04/17/2022    8:56 AM 02/27/2022    8:16 AM 11/27/2021    8:52 AM  Depression screen PHQ 2/9  Decreased Interest 1 1 1 2 1   Down, Depressed, Hopeless 0 1 1 1 1   PHQ - 2 Score 1 2 2 3 2   Altered sleeping 1 1 1 2 3   Tired, decreased energy 0 0 2 1 2   Change in appetite 1 2 2 2 1   Feeling bad or failure about yourself  0 0 0 1 1  Trouble concentrating 2 2 1 2 1   Moving slowly or fidgety/restless 1 1 0 1 1  Suicidal thoughts 0 0 0 0 1  PHQ-9 Score 6 8 8 12 12   Difficult doing work/chores Somewhat difficult Somewhat difficult Not difficult at all Not difficult at all Somewhat difficult      11/19/2022    9:49 AM 09/18/2022    8:15 AM 04/17/2022    8:57 AM 02/27/2022    8:17 AM  GAD 7 :  Generalized Anxiety Score  Nervous, Anxious, on Edge 1 2 1 2   Control/stop worrying 1 1 1 1   Worry too much - different things 1 2 1 2   Trouble relaxing 1 1 0 2  Restless 1 1 0 1  Easily annoyed or irritable 1 1 1 1   Afraid - awful might happen 1 2 1 2   Total GAD 7 Score 7 10 5 11   Anxiety Difficulty Somewhat difficult Somewhat difficult Not difficult at all Not difficult at all      Assessment & Plan: Improved sleep and symptoms in general, will further titrate given tolerance and scores.  Orders: -     buPROPion HCl ER (SR); Take 1 tablet (150 mg total) by mouth 2 (two) times daily.  Dispense: 180 tablet; Refill: 0  Cervicalgia Assessment & Plan: Cervical spine x-rays reviewed demonstrating multilevel degenerative changes, no radiculopathy endorsed and none on exam. Given recalcitrant nature of symptoms, referral to spine group placed. Will modify medication regimen, to remain PRN at this stage.  Orders: -     Celecoxib; Take 1 capsule (100 mg total) by mouth 2 (two) times daily as needed.  Dispense: 90 capsule;  Refill: 1 -     Methocarbamol; Take 1 tablet (750 mg total) by mouth every 6 (six) hours as needed for muscle spasms.  Dispense: 90 tablet; Refill: 3  Cervical spondylosis Assessment & Plan: Cervical spine x-rays reviewed demonstrating multilevel degenerative changes, no radiculopathy endorsed and none on exam. Given recalcitrant nature of symptoms, referral to spine group placed. Will modify medication regimen, to remain PRN at this stage.   Psychophysiological insomnia Assessment & Plan: Chronic, improved since initiation of Wellbutrin. See additional assessment(s) for plan details.      Orders & Medications Meds ordered this encounter  Medications   enalapril-hydrochlorothiazide (VASERETIC) 10-25 MG tablet    Sig: Take 1 tablet by mouth daily.    Dispense:  90 tablet    Refill:  0   buPROPion ER (WELLBUTRIN SR) 150 MG 12 hr tablet    Sig: Take 1 tablet  (150 mg total) by mouth 2 (two) times daily.    Dispense:  180 tablet    Refill:  0   celecoxib (CELEBREX) 100 MG capsule    Sig: Take 1 capsule (100 mg total) by mouth 2 (two) times daily as needed.    Dispense:  90 capsule    Refill:  1   methocarbamol (ROBAXIN-750) 750 MG tablet    Sig: Take 1 tablet (750 mg total) by mouth every 6 (six) hours as needed for muscle spasms.    Dispense:  90 tablet    Refill:  3   No orders of the defined types were placed in this encounter.    No follow-ups on file.     Eric Culver, MD, [facility]   Primary Care Sports Medicine Primary Care and Sports Medicine at [facility]

## 2022-11-19 NOTE — Assessment & Plan Note (Addendum)
Chronic, improved since initiation of Wellbutrin. See additional assessment(s) for plan details.

## 2022-11-19 NOTE — Assessment & Plan Note (Signed)
Chronic, uncontrolled, denies cardiopulmonary complaints. Examination benign, will titrate to combo ACE-diuretic. He brings up insurance barriers so 3 months of medication has been written to allow for him to establish with new PCP.

## 2022-11-19 NOTE — Assessment & Plan Note (Signed)
Cervical spine x-rays reviewed demonstrating multilevel degenerative changes, no radiculopathy endorsed and none on exam. Given recalcitrant nature of symptoms, referral to spine group placed. Will modify medication regimen, to remain PRN at this stage.

## 2022-11-19 NOTE — Assessment & Plan Note (Signed)
Improved sleep and symptoms in general, will further titrate given tolerance and scores.

## 2022-11-22 ENCOUNTER — Telehealth: Payer: Self-pay | Admitting: Family Medicine

## 2022-11-22 ENCOUNTER — Other Ambulatory Visit: Payer: Self-pay | Admitting: Family Medicine

## 2022-11-22 MED ORDER — HYDROCHLOROTHIAZIDE 25 MG PO TABS: 25.0000 mg | ORAL_TABLET | Freq: Every day | ORAL | 0 refills | Status: AC

## 2022-11-22 MED ORDER — ENALAPRIL MALEATE 10 MG PO TABS: 10.0000 mg | ORAL_TABLET | Freq: Every day | ORAL | 0 refills | Status: AC

## 2022-11-22 NOTE — Telephone Encounter (Signed)
Pt wife stated she was advised by the pharmacy that the patient's new medication for blood pressure is not available. Medication is possibly backordered; they are just telling her they are having a hard time getting it. Pt wife did not know the medication name stated that it's a new blood pressure medication.   Possibly referring to enalapril-hydrochlorothiazide (VASERETIC) 10-25 MG tablet   Wife is requesting an alternative medication be sent to the pharmacy below.  [place], [place] [address]  Phone: [phone] Fax: [phone]  Hours: Not open 24 hours   Please advise.

## 2022-11-22 NOTE — Telephone Encounter (Signed)
Please let patient / spouse know that I have sent in 2 separate BP medications as the combo pill (vaseretic) is not available.

## 2022-11-22 NOTE — Telephone Encounter (Signed)
PC to pt, LVM letting pt know we have sent new prescriptions in.

## 2022-11-22 NOTE — Telephone Encounter (Signed)
Please advise 

## 2022-12-09 ENCOUNTER — Emergency Department (HOSPITAL_COMMUNITY)
Admission: EM | Admit: 2022-12-09 | Discharge: 2022-12-09 | Disposition: A | Discharge status: Home / Self Care | Payer: BLUE CROSS/BLUE SHIELD | Attending: Emergency Medicine | Admitting: Emergency Medicine

## 2022-12-09 ENCOUNTER — Emergency Department (HOSPITAL_COMMUNITY): Payer: BLUE CROSS/BLUE SHIELD

## 2022-12-09 ENCOUNTER — Other Ambulatory Visit: Payer: Self-pay

## 2022-12-09 DIAGNOSIS — R7309 Other abnormal glucose: Secondary | ICD-10-CM

## 2022-12-09 DIAGNOSIS — F1410 Cocaine abuse, uncomplicated: Secondary | ICD-10-CM | POA: Diagnosis not present

## 2022-12-09 DIAGNOSIS — Z79899 Other long term (current) drug therapy: Secondary | ICD-10-CM | POA: Insufficient documentation

## 2022-12-09 DIAGNOSIS — R7401 Elevation of levels of liver transaminase levels: Secondary | ICD-10-CM

## 2022-12-09 DIAGNOSIS — F1010 Alcohol abuse, uncomplicated: Secondary | ICD-10-CM | POA: Diagnosis not present

## 2022-12-09 DIAGNOSIS — Y906 Blood alcohol level of 120-199 mg/100 ml: Secondary | ICD-10-CM | POA: Insufficient documentation

## 2022-12-09 DIAGNOSIS — I10 Essential (primary) hypertension: Secondary | ICD-10-CM | POA: Insufficient documentation

## 2022-12-09 DIAGNOSIS — R739 Hyperglycemia, unspecified: Secondary | ICD-10-CM

## 2022-12-09 DIAGNOSIS — R002 Palpitations: Secondary | ICD-10-CM | POA: Diagnosis present

## 2022-12-09 LAB — CBC
HCT: 46.9 % (ref 39.0–52.0)
Hemoglobin: 16.2 g/dL (ref 13.0–17.0)
MCH: 30.7 pg (ref 26.0–34.0)
MCHC: 34.5 g/dL (ref 30.0–36.0)
MCV: 89 fL (ref 80.0–100.0)
Platelets: 255 10*3/uL (ref 150–400)
RBC: 5.27 MIL/uL (ref 4.22–5.81)
RDW: 12.3 % (ref 11.5–15.5)
WBC: 9.7 10*3/uL (ref 4.0–10.5)
nRBC: 0 % (ref 0.0–0.2)

## 2022-12-09 LAB — BASIC METABOLIC PANEL
Anion gap: 13 (ref 5–15)
BUN: 11 mg/dL (ref 6–20)
CO2: 25 mmol/L (ref 22–32)
Calcium: 8.9 mg/dL (ref 8.9–10.3)
Chloride: 99 mmol/L (ref 98–111)
Creatinine, Ser: 0.84 mg/dL (ref 0.61–1.24)
GFR, Estimated: >60 mL/min (ref >60)
Glucose, Bld: 160 mg/dL — ABNORMAL HIGH (ref 70–99)
Potassium: 3.5 mmol/L (ref 3.5–5.1)
Sodium: 137 mmol/L (ref 135–145)

## 2022-12-09 LAB — RAPID URINE DRUG SCREEN, HOSP PERFORMED
Amphetamines: NOT DETECTED
Barbiturates: NOT DETECTED
Benzodiazepines: NOT DETECTED
Cocaine: POSITIVE — AB
Opiates: NOT DETECTED
Tetrahydrocannabinol: NOT DETECTED

## 2022-12-09 LAB — HEPATIC FUNCTION PANEL
ALT: 45 U/L — ABNORMAL HIGH (ref 0–44)
AST: 32 U/L (ref 15–41)
Albumin: 4.1 g/dL (ref 3.5–5.0)
Alkaline Phosphatase: 77 U/L (ref 38–126)
Bilirubin, Direct: 0.1 mg/dL (ref 0.0–0.2)
Indirect Bilirubin: 0.6 mg/dL (ref 0.3–0.9)
Total Bilirubin: 0.7 mg/dL (ref 0.3–1.2)
Total Protein: 6.9 g/dL (ref 6.5–8.1)

## 2022-12-09 LAB — TROPONIN I (HIGH SENSITIVITY): Troponin I (High Sensitivity): 2 ng/L (ref <18)

## 2022-12-09 LAB — ETHANOL: Alcohol, Ethyl (B): 145 mg/dL — ABNORMAL HIGH (ref <10)

## 2022-12-09 MED ORDER — CHLORDIAZEPOXIDE HCL 25 MG PO CAPS: ORAL_CAPSULE | ORAL | 0 refills | Status: AC

## 2022-12-09 MED ORDER — SODIUM CHLORIDE 0.9 % IV BOLUS
1000.0000 mL | Freq: Once | INTRAVENOUS | Status: AC
  Administered 2022-12-09: 1000 mL via INTRAVENOUS

## 2022-12-09 NOTE — ED Provider Notes (Signed)
[place] EMERGENCY DEPARTMENT AT [facility] Provider Note   CSN: [id number] Arrival date & time: 12/09/22  0522     History  Chief Complaint  Patient presents with   Palpitations    Eric Sanford is a 47 y.o. male.  The history is provided by the patient.  Palpitations He has history of hypertension, hyperlipidemia, gout and comes in because of heart palpitations.  He states that for the last week he has been consuming a large amount of beer and has been using cocaine.  He admits to about 24 beers a day, cannot quantify his cocaine use.  He denies other drug use.  He has noted that his heart has been intermittently racing and has done this in the past when he has been using cocaine.  Apparently, he started on this binge on his birthday.  He denies other drug use.  He denies chest pain, heaviness, tightness, pressure.   Home Medications Prior to Admission medications   Medication Sig Start Date End Date Taking? Authorizing Provider  allopurinol (ZYLOPRIM) 300 MG tablet Take 1 tablet (300 mg total) by mouth daily. 09/18/22   [name], MD  buPROPion ER [facility] SR) 150 MG 12 hr tablet Take 1 tablet (150 mg total) by mouth 2 (two) times daily. 11/19/22 02/17/23  [name], MD  celecoxib (CELEBREX) 100 MG capsule Take 1 capsule (100 mg total) by mouth 2 (two) times daily as needed. 11/19/22   [name], MD  enalapril (VASOTEC) 10 MG tablet Take 1 tablet (10 mg total) by mouth daily. 11/22/22   [name], MD  hydrochlorothiazide (HYDRODIURIL) 25 MG tablet Take 1 tablet (25 mg total) by mouth daily. 11/22/22   [name], MD  hydrOXYzine (VISTARIL) 50 MG capsule Take 1 capsule (50 mg total) by mouth 3 (three) times daily as needed for anxiety. 09/18/22   [name], MD  Melatonin 5 MG CHEW Chew 5 mg by mouth at bedtime. Patient not taking: Reported on 11/19/2022    [provider]  methocarbamol (ROBAXIN-750) 750 MG tablet  Take 1 tablet (750 mg total) by mouth every 6 (six) hours as needed for muscle spasms. 11/19/22 02/17/23  [name], MD  metoprolol succinate (TOPROL-XL) 25 MG 24 hr tablet Take 1 tablet (25 mg total) by mouth daily. 09/18/22   [name], MD  simvastatin (ZOCOR) 20 MG tablet Take 1 tablet (20 mg total) by mouth daily. 09/18/22   [name], MD      Allergies    Patient has no known allergies.    Review of Systems   Review of Systems  Cardiovascular:  Positive for palpitations.  All other systems reviewed and are negative.   Physical Exam Updated Vital Signs BP (!) 182/125 (BP Location: Right Arm)   Pulse (!) 112   Temp 98.4 F (36.9 C) (Oral)   Resp 18   Wt 122.5 kg   SpO2 100%   BMI 37.66 kg/m  Physical Exam Vitals and nursing note reviewed.   47 year old male, resting comfortably and in no acute distress. Vital signs are significant for elevated blood pressure and heart rate. Oxygen saturation is 100%, which is normal. Head is normocephalic and atraumatic. PERRLA, EOMI. Oropharynx is clear. Neck is nontender and supple without adenopathy or JVD. Back is nontender and there is no CVA tenderness. Lungs are clear without rales, wheezes, or rhonchi. Chest is nontender. Heart has regular rate and  rhythm without murmur. Abdomen is soft, flat, nontender. Extremities have no cyanosis or edema, full range of motion is present. Skin is warm and dry without rash. Neurologic: Mental status is normal, cranial nerves are intact, moves all extremities equally.  ED Results / Procedures / Treatments   Labs (all labs ordered are listed, but only abnormal results are displayed) Labs Reviewed  BASIC METABOLIC PANEL - Abnormal; Notable for the following components:      Result Value   Glucose, Bld 160 (*)    All other components within normal limits  ETHANOL - Abnormal; Notable for the following components:   Alcohol, Ethyl (B) 145 (*)    All other components  within normal limits  HEPATIC FUNCTION PANEL - Abnormal; Notable for the following components:   ALT 45 (*)    All other components within normal limits  RAPID URINE DRUG SCREEN, HOSP PERFORMED - Abnormal; Notable for the following components:   Cocaine POSITIVE (*)    All other components within normal limits  CBC  TROPONIN I (HIGH SENSITIVITY)  TROPONIN I (HIGH SENSITIVITY)    EKG EKG Interpretation  Date/Time:  Sunday December 09 2022 05:29:33 EST Ventricular Rate:  111 PR Interval:  148 QRS Duration: 111 QT Interval:  332 QTC Calculation: 452 R Axis:   78 Text Interpretation: Sinus tachycardia Otherwise within normal limits When compared with ECG of 05/17/2021, HEART RATE has increased Confirmed by Delora Fuel ([id number]) on 12/09/2022 5:33:28 AM  Procedures Procedures  Cardiac monitor shows sinus tachycardia, per my interpretation.  Medications Ordered in ED Medications - No data to display  ED Course/ Medical Decision Making/ A&P                             Medical Decision Making Amount and/or Complexity of Data Reviewed Labs: ordered. Radiology: ordered.   Palpitations in patient who has been abusing cocaine and ethanol.  Currently, monitor just shows sinus tachycardia.  I have reviewed and interpreted his electrocardiogram, and my interpretation is sinus tachycardia, otherwise normal.  He certainly could be having episodes of PSVT or paroxysmal atrial fibrillation, I will maintain him on a cardiac monitor while laboratory workup is done.  At triage, he had CBC and basic metabolic panel ordered.  I have added ethanol level, hepatic function panel and urine drug screen.  I have ordered IV fluids.  I have reviewed and interpreted his laboratory test, and my interpretation is normal CBC, ethanol level at the level of legal intoxication, elevated random glucose level, minimally elevated ALT likely secondary to alcohol abuse, normal troponin.  Urine drug screen is positive for  cocaine consistent with patient's history.  While in the emergency department, a heart monitor has only shown mild sinus tachycardia or normal sinus rhythm, no episodes of PSVT or atrial fibrillation.  He does express desire to stop drinking but states he has not had problems with getting shaky when stopping drinking.  I am discharging him with a tapering dose of chlordiazepoxide as well as resources for alcohol and drug abuse.  Recommended cardiology follow-up only if palpitations continue after coming off of all drugs.  Final Clinical Impression(s) / ED Diagnoses Final diagnoses:  Palpitations  Cocaine abuse (HCC)  Alcohol abuse  Elevated random blood glucose level  Elevated ALT measurement    Rx / DC Orders ED Discharge Orders          Ordered    chlordiazePOXIDE (LIBRIUM) 25 MG  capsule        12/09/22 0093              Delora Fuel, MD 81/82/99 [phone]

## 2022-12-09 NOTE — ED Triage Notes (Signed)
Pt presents from home for heart palpitations following cocaine use tonight.   Has experienced this with cocaine use previously requiring hospitalization.   H/o HTN, HLD  Denies leg swelling

## 2022-12-09 NOTE — ED Notes (Signed)
Patient given urinal to provide urine sample.  

## 2022-12-09 NOTE — Discharge Instructions (Signed)
You need to stop consuming alcohol and stop using cocaine.  Both of these can affect your heart and cause palpitations.  If you continue having palpitations after being off of cocaine and alcohol for several weeks, your primary care provider can refer you to a cardiologist for further evaluation.

## 2023-02-22 ENCOUNTER — Other Ambulatory Visit: Payer: Self-pay | Admitting: Family Medicine

## 2023-02-22 DIAGNOSIS — M542 Cervicalgia: Secondary | ICD-10-CM

## 2023-02-22 NOTE — Telephone Encounter (Signed)
Requested Prescriptions  Pending Prescriptions Disp Refills   celecoxib (CELEBREX) 100 MG capsule [Pharmacy Med Name: Celecoxib 100 MG Oral Capsule] 90 capsule 0    Sig: TAKE 1 CAPSULE BY MOUTH TWICE DAILY AS NEEDED     Analgesics:  COX2 Inhibitors Failed - 02/22/2023  6:22 AM      Failed - Manual Review: Labs are only required if the patient has taken medication for more than 8 weeks.      Failed - ALT in normal range and within 360 days    ALT  Date Value Ref Range Status  12/09/2022 45 (H) 0 - 44 U/L Final         Passed - HGB in normal range and within 360 days    Hemoglobin  Date Value Ref Range Status  12/09/2022 16.2 13.0 - 17.0 g/dL Final         Passed - Cr in normal range and within 360 days    Creatinine, Ser  Date Value Ref Range Status  12/09/2022 0.84 0.61 - 1.24 mg/dL Final         Passed - HCT in normal range and within 360 days    HCT  Date Value Ref Range Status  12/09/2022 46.9 39.0 - 52.0 % Final         Passed - AST in normal range and within 360 days    AST  Date Value Ref Range Status  12/09/2022 32 15 - 41 U/L Final         Passed - eGFR is 30 or above and within 360 days    GFR, Estimated  Date Value Ref Range Status  12/09/2022 >60 >60 mL/min Final    Comment:    (NOTE) Calculated using the CKD-EPI Creatinine Equation (2021)          Passed - Patient is not pregnant      Passed - Valid encounter within last 12 months    Recent Outpatient Visits           3 months ago Primary hypertension   [place] [facility], Ocie Bob, MD   5 months ago Primary hypertension   [place] [facility], Ocie Bob, MD   10 months ago Depression with anxiety   [facility] at [facility], Ocie Bob, MD   12 months ago Depression with anxiety   [facility] at [facility],  Ocie Bob, MD   1 year ago Depression with anxiety   [facility] Medicine at [facility], Ocie Bob, MD

## 2023-05-13 ENCOUNTER — Telehealth: Payer: Self-pay | Admitting: Family Medicine

## 2023-05-13 NOTE — Telephone Encounter (Signed)
Refill

## 2023-05-13 NOTE — Telephone Encounter (Signed)
Medication Refill - Medication: Medication allopurinol (ZYLOPRIM) 300 MG tablet [311] allopurinol (ZYLOPRIM) 300 MG tablet [[id number]]   Has the patient contacted their pharmacy? Yes.   (Agent: If no, request that the patient contact the pharmacy for the refill. If patient does not wish to contact the pharmacy document the reason why and proceed with request.) (Agent: If yes, when and what did the pharmacy advise?)  Preferred Pharmacy (with phone number or street name):  [facility] [address] [place], [place] - [address] Phone: [phone]  Fax: [phone]     Has the patient been seen for an appointment in the last year OR does the patient have an upcoming appointment? Yes.    Agent: Please be advised that RX refills may take up to 3 business days. We ask that you follow-up with your pharmacy.

## 2023-05-15 ENCOUNTER — Other Ambulatory Visit: Payer: Self-pay | Admitting: Family Medicine

## 2023-05-15 ENCOUNTER — Telehealth: Payer: Self-pay | Admitting: Family Medicine

## 2023-05-15 DIAGNOSIS — M109 Gout, unspecified: Secondary | ICD-10-CM

## 2023-05-15 NOTE — Telephone Encounter (Signed)
Requested medication (s) are due for refill today: Yes  Requested medication (s) are on the active medication list: Yes  Last refill:  09/18/22 #90 0RF  Future visit scheduled: No  Notes to clinic:  Unable to refill per protocol due to failed labs, no updated results.      Requested Prescriptions  Pending Prescriptions Disp Refills   allopurinol (ZYLOPRIM) 300 MG tablet [Pharmacy Med Name: Allopurinol 300 MG Oral Tablet] 90 tablet 0    Sig: Take 1 tablet by mouth once daily     Endocrinology:  Gout Agents - allopurinol Failed - 05/15/2023  9:16 AM      Failed - Uric Acid in normal range and within 360 days    No results found for: "POCURA", "LABURIC"       Failed - CBC within normal limits and completed in the last 12 months    WBC  Date Value Ref Range Status  12/09/2022 9.7 4.0 - 10.5 K/uL Final   RBC  Date Value Ref Range Status  12/09/2022 5.27 4.22 - 5.81 MIL/uL Final   Hemoglobin  Date Value Ref Range Status  12/09/2022 16.2 13.0 - 17.0 g/dL Final   HCT  Date Value Ref Range Status  12/09/2022 46.9 39.0 - 52.0 % Final   MCHC  Date Value Ref Range Status  12/09/2022 34.5 30.0 - 36.0 g/dL Final   [facility]  Date Value Ref Range Status  12/09/2022 30.7 26.0 - 34.0 pg Final   MCV  Date Value Ref Range Status  12/09/2022 89.0 80.0 - 100.0 fL Final   No results found for: "PLTCOUNTKUC", "LABPLAT", "POCPLA" RDW  Date Value Ref Range Status  12/09/2022 12.3 11.5 - 15.5 % Final         Passed - Cr in normal range and within 360 days    Creatinine, Ser  Date Value Ref Range Status  12/09/2022 0.84 0.61 - 1.24 mg/dL Final         Passed - Valid encounter within last 12 months    Recent Outpatient Visits           5 months ago Primary hypertension   [place] [facility], Ocie Bob, MD   7 months ago Primary hypertension   [place] [facility], Ocie Bob, MD   1  year ago Depression with anxiety    [facility] at [facility], Ocie Bob, MD   1 year ago Depression with anxiety   [facility] at [facility], Ocie Bob, MD   1 year ago Depression with anxiety   [facility] at [facility], Ocie Bob, MD

## 2023-06-24 ENCOUNTER — Other Ambulatory Visit: Payer: Self-pay | Admitting: Family Medicine

## 2023-06-24 DIAGNOSIS — M542 Cervicalgia: Secondary | ICD-10-CM

## 2023-06-25 NOTE — Telephone Encounter (Signed)
Requested Prescriptions  Pending Prescriptions Disp Refills   celecoxib (CELEBREX) 100 MG capsule [Pharmacy Med Name: Celecoxib 100 MG Oral Capsule] 90 capsule 0    Sig: TAKE 1 CAPSULE BY MOUTH TWICE DAILY AS NEEDED     Analgesics:  COX2 Inhibitors Failed - 06/24/2023  6:21 AM      Failed - Manual Review: Labs are only required if the patient has taken medication for more than 8 weeks.      Failed - ALT in normal range and within 360 days    ALT  Date Value Ref Range Status  12/09/2022 45 (H) 0 - 44 U/L Final         Passed - HGB in normal range and within 360 days    Hemoglobin  Date Value Ref Range Status  12/09/2022 16.2 13.0 - 17.0 g/dL Final         Passed - Cr in normal range and within 360 days    Creatinine, Ser  Date Value Ref Range Status  12/09/2022 0.84 0.61 - 1.24 mg/dL Final         Passed - HCT in normal range and within 360 days    HCT  Date Value Ref Range Status  12/09/2022 46.9 39.0 - 52.0 % Final         Passed - AST in normal range and within 360 days    AST  Date Value Ref Range Status  12/09/2022 32 15 - 41 U/L Final         Passed - eGFR is 30 or above and within 360 days    GFR, Estimated  Date Value Ref Range Status  12/09/2022 >60 >60 mL/min Final    Comment:    (NOTE) Calculated using the CKD-EPI Creatinine Equation (2021)          Passed - Patient is not pregnant      Passed - Valid encounter within last 12 months    Recent Outpatient Visits           7 months ago Primary hypertension   [place] [facility], Ocie Bob, MD   9 months ago Primary hypertension   [place] [facility], Ocie Bob, MD   1 year ago Depression with anxiety   [place] [facility], Ocie Bob, MD   1 year ago Depression with anxiety   [facility] at [facility], Ocie Bob, MD   1 year ago Depression with anxiety   [facility] at [facility], Ocie Bob, MD

## 2023-08-12 ENCOUNTER — Other Ambulatory Visit: Payer: Self-pay | Admitting: Family Medicine

## 2023-08-12 DIAGNOSIS — M542 Cervicalgia: Secondary | ICD-10-CM

## 2023-08-12 NOTE — Telephone Encounter (Signed)
Requested Prescriptions  Pending Prescriptions Disp Refills   celecoxib (CELEBREX) 100 MG capsule [Pharmacy Med Name: Celecoxib 100 MG Oral Capsule] 90 capsule 0    Sig: TAKE 1 CAPSULE BY MOUTH TWICE DAILY AS NEEDED     Analgesics:  COX2 Inhibitors Failed - 08/12/2023  6:21 AM      Failed - Manual Review: Labs are only required if the patient has taken medication for more than 8 weeks.      Failed - ALT in normal range and within 360 days    ALT  Date Value Ref Range Status  12/09/2022 45 (H) 0 - 44 U/L Final         Passed - HGB in normal range and within 360 days    Hemoglobin  Date Value Ref Range Status  12/09/2022 16.2 13.0 - 17.0 g/dL Final         Passed - Cr in normal range and within 360 days    Creatinine, Ser  Date Value Ref Range Status  12/09/2022 0.84 0.61 - 1.24 mg/dL Final         Passed - HCT in normal range and within 360 days    HCT  Date Value Ref Range Status  12/09/2022 46.9 39.0 - 52.0 % Final         Passed - AST in normal range and within 360 days    AST  Date Value Ref Range Status  12/09/2022 32 15 - 41 U/L Final         Passed - eGFR is 30 or above and within 360 days    GFR, Estimated  Date Value Ref Range Status  12/09/2022 >60 >60 mL/min Final    Comment:    (NOTE) Calculated using the CKD-EPI Creatinine Equation (2021)          Passed - Patient is not pregnant      Passed - Valid encounter within last 12 months    Recent Outpatient Visits           8 months ago Primary hypertension   [place] [facility], Ocie Bob, MD   10 months ago Primary hypertension   [place] [facility], Ocie Bob, MD   1 year ago Depression with anxiety   [place] [facility], Ocie Bob, MD   1 year ago Depression with anxiety   [facility] at [facility],  Ocie Bob, MD   1 year ago Depression with anxiety   [facility] at [facility], Ocie Bob, MD

## 2023-08-17 ENCOUNTER — Other Ambulatory Visit: Payer: Self-pay | Admitting: Family Medicine

## 2023-08-17 DIAGNOSIS — M109 Gout, unspecified: Secondary | ICD-10-CM

## 2023-08-19 NOTE — Telephone Encounter (Signed)
Requested Prescriptions  Pending Prescriptions Disp Refills   allopurinol (ZYLOPRIM) 300 MG tablet [Pharmacy Med Name: Allopurinol 300 MG Oral Tablet] 90 tablet 0    Sig: Take 1 tablet by mouth once daily     Endocrinology:  Gout Agents - allopurinol Failed - 08/17/2023  2:24 PM      Failed - Uric Acid in normal range and within 360 days    No results found for: "POCURA", "LABURIC"       Failed - CBC within normal limits and completed in the last 12 months    WBC  Date Value Ref Range Status  12/09/2022 9.7 4.0 - 10.5 K/uL Final   RBC  Date Value Ref Range Status  12/09/2022 5.27 4.22 - 5.81 MIL/uL Final   Hemoglobin  Date Value Ref Range Status  12/09/2022 16.2 13.0 - 17.0 g/dL Final   HCT  Date Value Ref Range Status  12/09/2022 46.9 39.0 - 52.0 % Final   MCHC  Date Value Ref Range Status  12/09/2022 34.5 30.0 - 36.0 g/dL Final   [facility]  Date Value Ref Range Status  12/09/2022 30.7 26.0 - 34.0 pg Final   MCV  Date Value Ref Range Status  12/09/2022 89.0 80.0 - 100.0 fL Final   No results found for: "PLTCOUNTKUC", "LABPLAT", "POCPLA" RDW  Date Value Ref Range Status  12/09/2022 12.3 11.5 - 15.5 % Final         Passed - Cr in normal range and within 360 days    Creatinine, Ser  Date Value Ref Range Status  12/09/2022 0.84 0.61 - 1.24 mg/dL Final         Passed - Valid encounter within last 12 months    Recent Outpatient Visits           9 months ago Primary hypertension   [place] [facility], Ocie Bob, MD   11 months ago Primary hypertension   [place] [facility], Ocie Bob, MD   1 year ago Depression with anxiety   [place] [facility], Ocie Bob, MD   1 year ago Depression with anxiety   [facility] at [facility], Ocie Bob, MD   1 year ago Depression with anxiety    [facility] at [facility], Ocie Bob, MD

## 2023-09-26 ENCOUNTER — Other Ambulatory Visit: Payer: Self-pay | Admitting: Family Medicine

## 2023-09-26 DIAGNOSIS — M542 Cervicalgia: Secondary | ICD-10-CM

## 2023-09-27 NOTE — Telephone Encounter (Signed)
Requested Prescriptions  Pending Prescriptions Disp Refills   celecoxib (CELEBREX) 100 MG capsule [Pharmacy Med Name: Celecoxib 100 MG Oral Capsule] 90 capsule 0    Sig: TAKE 1 CAPSULE BY MOUTH TWICE DAILY AS NEEDED     Analgesics:  COX2 Inhibitors Failed - 09/26/2023  6:21 AM      Failed - Manual Review: Labs are only required if the patient has taken medication for more than 8 weeks.      Failed - ALT in normal range and within 360 days    ALT  Date Value Ref Range Status  12/09/2022 45 (H) 0 - 44 U/L Final         Passed - HGB in normal range and within 360 days    Hemoglobin  Date Value Ref Range Status  12/09/2022 16.2 13.0 - 17.0 g/dL Final         Passed - Cr in normal range and within 360 days    Creatinine, Ser  Date Value Ref Range Status  12/09/2022 0.84 0.61 - 1.24 mg/dL Final         Passed - HCT in normal range and within 360 days    HCT  Date Value Ref Range Status  12/09/2022 46.9 39.0 - 52.0 % Final         Passed - AST in normal range and within 360 days    AST  Date Value Ref Range Status  12/09/2022 32 15 - 41 U/L Final         Passed - eGFR is 30 or above and within 360 days    GFR, Estimated  Date Value Ref Range Status  12/09/2022 >60 >60 mL/min Final    Comment:    (NOTE) Calculated using the CKD-EPI Creatinine Equation (2021)          Passed - Patient is not pregnant      Passed - Valid encounter within last 12 months    Recent Outpatient Visits           10 months ago Primary hypertension   [place] [facility], Ocie Bob, MD   1 year ago Primary hypertension   [place] [facility], Ocie Bob, MD   1 year ago Depression with anxiety   [place] [facility], Ocie Bob, MD   1 year ago Depression with anxiety   [facility] at [facility], Ocie Bob, MD   1 year ago Depression with anxiety   [facility] at [facility], Ocie Bob, MD

## 2024-10-03 ENCOUNTER — Ambulatory Visit
Admission: RE | Admit: 2024-10-03 | Discharge: 2024-10-03 | Disposition: A | Discharge status: Home / Self Care | Attending: Physician Assistant | Admitting: Physician Assistant

## 2024-10-03 VITALS — BP 162/117 | HR 88 | Temp 97.4°F | Resp 16

## 2024-10-03 DIAGNOSIS — M545 Low back pain, unspecified: Secondary | ICD-10-CM | POA: Diagnosis not present

## 2024-10-03 MED ORDER — KETOROLAC TROMETHAMINE 30 MG/ML IJ SOLN
30.0000 mg | Freq: Once | INTRAMUSCULAR | Status: AC
  Administered 2024-10-03: 30 mg via INTRAMUSCULAR

## 2024-10-03 MED ORDER — PREDNISONE 20 MG PO TABS: ORAL_TABLET | ORAL | 0 refills | Status: AC

## 2024-10-03 NOTE — Discharge Instructions (Addendum)
 VISIT SUMMARY:  You came in today because of back pain that started after lifting a heavy transmission at work. The pain has been severe, but you are able to walk now, although it still hurts.  YOUR PLAN:  -ACUTE LUMBAR MUSCLE STRAIN: This means you have a muscle injury in your lower back, likely from lifting the heavy transmission. We gave you a Toradol injection for immediate pain relief and prescribed a steroid taper to reduce inflammation. You should use Tylenol  for pain management instead of NSAIDs like ibuprofen to avoid stomach issues. Continue using Voltaren gel, warm compresses, lidocaine patches, and massage. Perform the stretches provided at home. Follow up if your symptoms do not improve in 1-2 weeks or if they worsen significantly. If the pain persists beyond 3 weeks or remains at the current level, we may need to consider an orthopedic evaluation.  INSTRUCTIONS:  Follow up if your symptoms do not improve in 1-2 weeks or if they worsen significantly. If the pain persists beyond 3 weeks or remains at the current level, we may need to consider an orthopedic evaluation.

## 2024-10-03 NOTE — ED Provider Notes (Signed)
 [facility]    CSN: [id number] Arrival date & time: 10/03/24  1330      History   Chief Complaint Chief Complaint  Patient presents with   Back Pain    HPI Eric Sanford is a 48 y.o. male.  has a past medical history of Allergy, Gout, Hyperlipidemia, and Hypertension.   HPI  Discussed the use of AI scribe software for clinical note transcription with the patient, who gave verbal consent to proceed.  The patient presents with back pain following heavy lifting.  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 The pain is localized to the left side of his back and is rated as an eight out of ten in severity. It worsens with certain movements and improves when lying down.  No fever, chills, pain radiating down the legs, or incontinence. The patient reported that on the first day he was unable to walk and was just standing, but now he is able to walk, though he still feels pain.  Current management includes the use of a lidocaine patch, Voltaren gel, and ibuprofen.   Past Medical History:  Diagnosis Date   Allergy    Gout    Hyperlipidemia    Hypertension     Patient Active Problem List   Diagnosis Date Noted   Cervical spondylosis 09/18/2022   Tinnitus of left ear 11/27/2021   Anxiety and depression    Allergic rhinitis 10/19/2021   Psychophysiological insomnia 08/04/2021   Chronic midline low back pain without sciatica 07/05/2021   Primary hypertension 07/05/2021   Depression with anxiety 07/05/2021   Hyperlipidemia 07/05/2021    Past Surgical History:  Procedure Laterality Date   TOOTH EXTRACTION  2017       Home Medications    Prior to Admission medications   Medication Sig Start Date End Date Taking? Authorizing Provider  predniSONE  (DELTASONE ) 20 MG tablet Take 60mg  PO daily x 2 days, then40mg  PO daily x 2 days, then 20mg  PO daily x 3 days  10/03/24  Yes Melessa Cowell E, PA-C  allopurinol  (ZYLOPRIM ) 300 MG tablet Take 1 tablet by mouth once daily 08/19/23   Matthews, Jason J, MD  buPROPion  ER (WELLBUTRIN  SR) 150 MG 12 hr tablet Take 1 tablet (150 mg total) by mouth 2 (two) times daily. 11/19/22 02/17/23  Matthews, Jason J, MD  celecoxib  (CELEBREX ) 100 MG capsule TAKE 1 CAPSULE BY MOUTH TWICE DAILY AS NEEDED 09/27/23   Matthews, Jason J, MD  chlordiazePOXIDE  (LIBRIUM ) 25 MG capsule 50mg  PO TID x 1D, then 25-50mg  PO BID X 1D, then 25-50mg  PO QD X 1D 12/09/22   Raford Lenis, MD  enalapril  (VASOTEC ) 10 MG tablet Take 1 tablet (10 mg total) by mouth daily. 11/22/22   Alvia Selinda PARAS, MD  hydrochlorothiazide  (HYDRODIURIL ) 25 MG tablet Take 1 tablet (25 mg total) by mouth daily. 11/22/22   Matthews, Jason J, MD  hydrOXYzine  (VISTARIL ) 50 MG capsule Take 1 capsule (50 mg total) by mouth 3 (three) times daily as needed for anxiety. 09/18/22   Matthews, Jason J, MD  Melatonin 5 MG CHEW Chew 5 mg by mouth at bedtime. Patient not taking: Reported on 11/19/2022    [provider]  metoprolol  succinate (TOPROL -XL) 25 MG 24 hr tablet Take 1 tablet (25 mg total) by mouth daily. 09/18/22   Matthews, Jason J, MD  simvastatin  (ZOCOR ) 20 MG tablet Take  1 tablet (20 mg total) by mouth daily. 09/18/22   Alvia Selinda PARAS, MD    Family History Family History  Problem Relation Age of Onset   Diabetes Mother    Asthma Father    Hypertension Sister    Hypertension Brother    Heart attack Maternal Grandmother    Heart attack Paternal Grandmother    Alzheimer's disease Paternal Grandfather     Social History Social History   Tobacco Use   Smoking status: Never    Passive exposure: Current   Smokeless tobacco: Never  Vaping Use   Vaping status: Never Used  Substance Use Topics   Alcohol use: Yes    Alcohol/week: 6.0 standard drinks of alcohol    Types: 6 Standard drinks or equivalent per week   Drug use: Not Currently    Types: Marijuana      Allergies   Patient has no known allergies.   Review of Systems Review of Systems  Constitutional:  Negative for chills and fever.  Musculoskeletal:  Positive for back pain and gait problem.     Physical Exam Triage Vital Signs ED Triage Vitals [10/03/24 1342]  Encounter Vitals Group     BP (!) 162/117     Girls Systolic BP Percentile      Girls Diastolic BP Percentile      Boys Systolic BP Percentile      Boys Diastolic BP Percentile      Pulse Rate 88     Resp 16     Temp (!) 97.4 F (36.3 C)     Temp Source Oral     SpO2 95 %     Weight      Height      Head Circumference      Peak Flow      Pain Score 8     Pain Loc      Pain Education      Exclude from Growth Chart    No data found.  Updated Vital Signs BP (!) 162/117 (BP Location: Right Arm)   Pulse 88   Temp (!) 97.4 F (36.3 C) (Oral)   Resp 16   SpO2 95%   Visual Acuity Right Eye Distance:   Left Eye Distance:   Bilateral Distance:    Right Eye Near:   Left Eye Near:    Bilateral Near:     Physical Exam Vitals reviewed.  Constitutional:      General: He is awake.     Appearance: Normal appearance. He is well-developed and well-groomed.  HENT:     Head: Normocephalic and atraumatic.  Eyes:     Extraocular Movements: Extraocular movements intact.     Conjunctiva/sclera: Conjunctivae normal.  Pulmonary:     Effort: Pulmonary effort is normal.  Musculoskeletal:     Cervical back: Normal range of motion.     Thoracic back: Spasms present. No tenderness or bony tenderness. Decreased range of motion.     Lumbar back: Spasms and tenderness present. No swelling. Decreased range of motion.       Back:  Neurological:     Mental Status: He is alert and oriented to person, place, and time.  Psychiatric:        Attention and Perception: Attention normal.        Mood and Affect: Mood normal.        Speech: Speech normal.        Behavior: Behavior normal. Behavior is cooperative.  UC Treatments / Results  Labs (all labs ordered are listed, but only abnormal results are displayed) Labs Reviewed - No data to display  EKG   Radiology No results found.  Procedures Procedures (including critical care time)  Medications Ordered in UC Medications  ketorolac (TORADOL) 30 MG/ML injection 30 mg (30 mg Intramuscular Given 10/03/24 1439)    Initial Impression / Assessment and Plan / UC Course  I have reviewed the triage vital signs and the nursing notes.  Pertinent labs & imaging results that were available during my care of the patient were reviewed by me and considered in my medical decision making (see chart for details).      Final Clinical Impressions(s) / UC Diagnoses   Final diagnoses:  Acute left-sided low back pain without sciatica   Acute lumbar muscle strain Likely due to heavy lifting of a transmission. Pain localized to the side of the back, rated 8/10 in severity. No fever, chills, or radicular symptoms. Pain exacerbated by movement, relieved by lying down. Differential includes muscle pull or strain, with muscle tear considered but not visible without advanced imaging. Current management includes lidocaine patches, Voltaren, and ibuprofen. - Administered Toradol injection for immediate pain relief. - Prescribed a steroid taper to reduce inflammation. - Recommended Tylenol  for pain management, avoiding NSAIDs due to potential gastrointestinal upset with prednisone  taper. - Continue Voltaren gel, warm compresses, lidocaine patches, and massage. - Provided stretches to perform at home. - Advised follow-up if symptoms do not improve in 1-2 weeks or worsen significantly. - Suggested orthopedic evaluation if symptoms persist beyond 3 weeks or remain at current level.    Discharge Instructions      VISIT SUMMARY:  You came in today because of back pain that started after lifting a heavy transmission at work. The pain has been severe, but  you are able to walk now, although it still hurts.  YOUR PLAN:  -ACUTE LUMBAR MUSCLE STRAIN: This means you have a muscle injury in your lower back, likely from lifting the heavy transmission. We gave you a Toradol injection for immediate pain relief and prescribed a steroid taper to reduce inflammation. You should use Tylenol  for pain management instead of NSAIDs like ibuprofen to avoid stomach issues. Continue using Voltaren gel, warm compresses, lidocaine patches, and massage. Perform the stretches provided at home. Follow up if your symptoms do not improve in 1-2 weeks or if they worsen significantly. If the pain persists beyond 3 weeks or remains at the current level, we may need to consider an orthopedic evaluation.  INSTRUCTIONS:  Follow up if your symptoms do not improve in 1-2 weeks or if they worsen significantly. If the pain persists beyond 3 weeks or remains at the current level, we may need to consider an orthopedic evaluation.     ED Prescriptions     Medication Sig Dispense Auth. Provider   predniSONE  (DELTASONE ) 20 MG tablet Take 60mg  PO daily x 2 days, then40mg  PO daily x 2 days, then 20mg  PO daily x 3 days 13 tablet Jabrea Kallstrom E, PA-C      PDMP not reviewed this encounter.   Marylene Rocky BRAVO, PA-C 10/03/24 1441

## 2024-10-03 NOTE — ED Triage Notes (Signed)
 Pt states left mid back pain for the past 3 days.  States he did heavy lifting 2 days prior.  States he has been using ibuprofen,heat and lidocaine patches with no relief.
